# Patient Record
Sex: Female | Born: 1955 | Race: White | Hispanic: No | Smoking: Never smoker
Health system: Southern US, Community
[De-identification: ages and names within clinical notes are randomized; demographics above are authoritative.]

## PROBLEM LIST (undated history)

## (undated) ENCOUNTER — Emergency Department (HOSPITAL_BASED_OUTPATIENT_CLINIC_OR_DEPARTMENT_OTHER): Payer: Managed Care, Other (non HMO) | Source: Home / Self Care

## (undated) ENCOUNTER — Emergency Department (HOSPITAL_BASED_OUTPATIENT_CLINIC_OR_DEPARTMENT_OTHER): Admission: EM | Payer: Managed Care, Other (non HMO) | Source: Home / Self Care

## (undated) DIAGNOSIS — C50412 Malignant neoplasm of upper-outer quadrant of left female breast: Secondary | ICD-10-CM

## (undated) DIAGNOSIS — Z923 Personal history of irradiation: Secondary | ICD-10-CM

## (undated) DIAGNOSIS — K219 Gastro-esophageal reflux disease without esophagitis: Secondary | ICD-10-CM

## (undated) DIAGNOSIS — C50919 Malignant neoplasm of unspecified site of unspecified female breast: Secondary | ICD-10-CM

## (undated) DIAGNOSIS — Z87442 Personal history of urinary calculi: Secondary | ICD-10-CM

## (undated) DIAGNOSIS — R011 Cardiac murmur, unspecified: Secondary | ICD-10-CM

## (undated) DIAGNOSIS — T7840XA Allergy, unspecified, initial encounter: Secondary | ICD-10-CM

## (undated) HISTORY — PX: COLONOSCOPY: SHX174

## (undated) HISTORY — DX: Malignant neoplasm of upper-outer quadrant of left female breast: C50.412

## (undated) HISTORY — PX: ABDOMINAL HYSTERECTOMY: SHX81

## (undated) HISTORY — PX: BREAST SURGERY: SHX581

## (undated) HISTORY — PX: PILONIDAL CYST EXCISION: SHX744

---

## 1999-02-09 ENCOUNTER — Ambulatory Visit (HOSPITAL_COMMUNITY): Admission: RE | Admit: 1999-02-09 | Discharge: 1999-02-09 | Payer: Self-pay | Admitting: Orthopedic Surgery

## 1999-02-09 ENCOUNTER — Encounter: Payer: Self-pay | Admitting: Orthopedic Surgery

## 2000-09-17 ENCOUNTER — Other Ambulatory Visit: Admission: RE | Admit: 2000-09-17 | Discharge: 2000-09-17 | Payer: Self-pay | Admitting: Gynecology

## 2000-09-19 ENCOUNTER — Inpatient Hospital Stay (HOSPITAL_COMMUNITY): Admission: RE | Admit: 2000-09-19 | Discharge: 2000-09-21 | Payer: Self-pay | Admitting: Gynecology

## 2000-09-19 ENCOUNTER — Encounter (INDEPENDENT_AMBULATORY_CARE_PROVIDER_SITE_OTHER): Payer: Self-pay | Admitting: Specialist

## 2001-09-24 ENCOUNTER — Other Ambulatory Visit: Admission: RE | Admit: 2001-09-24 | Discharge: 2001-09-24 | Payer: Self-pay | Admitting: Gynecology

## 2002-11-24 ENCOUNTER — Other Ambulatory Visit: Admission: RE | Admit: 2002-11-24 | Discharge: 2002-11-24 | Payer: Self-pay | Admitting: Gynecology

## 2004-08-11 ENCOUNTER — Other Ambulatory Visit: Admission: RE | Admit: 2004-08-11 | Discharge: 2004-08-11 | Payer: Self-pay | Admitting: Gynecology

## 2005-11-07 ENCOUNTER — Other Ambulatory Visit: Admission: RE | Admit: 2005-11-07 | Discharge: 2005-11-07 | Payer: Self-pay | Admitting: Gynecology

## 2006-01-11 ENCOUNTER — Ambulatory Visit: Payer: Self-pay | Admitting: Psychology

## 2006-02-06 ENCOUNTER — Ambulatory Visit: Payer: Self-pay | Admitting: Psychology

## 2006-03-06 ENCOUNTER — Ambulatory Visit: Payer: Self-pay | Admitting: Psychology

## 2006-04-03 ENCOUNTER — Ambulatory Visit: Payer: Self-pay | Admitting: Psychology

## 2006-07-09 ENCOUNTER — Ambulatory Visit: Payer: Self-pay | Admitting: Psychology

## 2007-03-04 ENCOUNTER — Other Ambulatory Visit: Admission: RE | Admit: 2007-03-04 | Discharge: 2007-03-04 | Payer: Self-pay | Admitting: Gynecology

## 2008-11-17 ENCOUNTER — Other Ambulatory Visit: Admission: RE | Admit: 2008-11-17 | Discharge: 2008-11-17 | Payer: Self-pay | Admitting: Family Medicine

## 2009-01-27 ENCOUNTER — Encounter: Admission: RE | Admit: 2009-01-27 | Discharge: 2009-01-27 | Payer: Self-pay | Admitting: Family Medicine

## 2009-09-19 ENCOUNTER — Ambulatory Visit: Payer: Self-pay | Admitting: Diagnostic Radiology

## 2009-09-19 ENCOUNTER — Emergency Department (HOSPITAL_BASED_OUTPATIENT_CLINIC_OR_DEPARTMENT_OTHER): Admission: EM | Admit: 2009-09-19 | Discharge: 2009-09-19 | Payer: Self-pay | Admitting: Emergency Medicine

## 2010-03-24 LAB — BASIC METABOLIC PANEL
BUN: 18 mg/dL (ref 6–23)
CO2: 28 mEq/L (ref 19–32)
Calcium: 9.3 mg/dL (ref 8.4–10.5)
Chloride: 102 mEq/L (ref 96–112)
Creatinine, Ser: 0.7 mg/dL (ref 0.4–1.2)
GFR calc Af Amer: >60 mL/min (ref >60)
GFR calc non Af Amer: >60 mL/min (ref >60)
Glucose, Bld: 118 mg/dL — ABNORMAL HIGH (ref 70–99)
Potassium: 3.9 mEq/L (ref 3.5–5.1)
Sodium: 140 mEq/L (ref 135–145)

## 2010-03-24 LAB — URINALYSIS, ROUTINE W REFLEX MICROSCOPIC
Bilirubin Urine: NEGATIVE
Glucose, UA: NEGATIVE mg/dL
Hgb urine dipstick: NEGATIVE
Ketones, ur: 15 mg/dL — AB
Nitrite: NEGATIVE
Protein, ur: NEGATIVE mg/dL
Specific Gravity, Urine: 1.018 (ref 1.005–1.030)
Urobilinogen, UA: 0.2 mg/dL (ref 0.0–1.0)
pH: 5.5 (ref 5.0–8.0)

## 2010-05-27 NOTE — H&P (Signed)
[facility]  Patient:    Laurie Calhoun, Laurie Calhoun Visit Number: [id number] MRN: [id number]          Service Type: Attending:  Leatha Gilding. Mezer, M.D. Dictated by:   Leatha Gilding. Mezer, M.D. Adm. Date:  09/19/00                           History and Physical  ADMITTING DIAGNOSIS:  Fibroid uterus.  HISTORY OF PRESENT ILLNESS:  The patient is a 55 year old gravida 1, ectopic 1 female admitted with a fibroid uterus, menorrhagia, dyspareunia and back pain for a total abdominal hysterectomy, ? bilateral salpingo-oophorectomy.  Patient has a progressively enlarging fibroid and long-standing menorrhagia, dyspareunia and back pain unresponsive to conservative therapy.  The patient is admitted for definitive therapy.  The patient wishes to proceed with total abdominal hysterectomy and bilateral salpingo-oophorectomy if surgically indicated or if significant pathology.  A total abdominal hysterectomy/? bilateral salpingo-oophorectomy have been discussed in detail with the patient and potential complications including, but not limited to, anesthesia, injury to the bowel, bladder or ureters, possible fistula formation, possible blood loss with a transfusion and its sequelae and possible infection in the pelvis or in the wound.  Permanent sterilization has been stressed.  The patient understands that there is no guarantee to relieve her pelvic pain or her back pain.  Patients questions have been answered.  Postoperative expectations and restrictions have been reviewed with the patient in detail preoperatively.  PAST MEDICAL HISTORY: Surgical:  GIFT x 4, pilonidal cyst, breast biopsy, culdoscopy.  Medical:  Noncontributory.  MEDICATIONS:  None.  ALLERGIES:  PENICILLIN.  SOCIAL HISTORY:  Smokes none.  ETOH:  Occasional.  FAMILY HISTORY:  Positive for lung cancer and colon cancer.  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  PHYSICAL  EXAMINATION:  HEENT:  Negative.  LUNGS:  Clear.  HEART:  Without murmurs.  BREASTS:  Without masses or discharge.  ABDOMEN:  Soft and nontender.  PELVIC:  Exam reveals BUS, vagina and cervix to be normal.  The uterus is enlarged with a large posterior myoma.  Adnexa without palpable masses.  RECTAL:  Negative.  EXTREMITIES:  Negative.  IMPRESSION: 1. Fibroid uterus. 2. Menorrhagia. 3. Dyspareunia. 4. Back pain.  PLAN:  Total abdominal hysterectomy, ? bilateral salpingo-oophorectomy. Dictated by:   Leatha Gilding. Mezer, M.D. Attending:  Leatha Gilding. Mezer, M.D. DD:  09/20/00 TD:  09/20/00 Job: [id number]

## 2010-05-27 NOTE — Op Note (Signed)
[facility]  Patient:    Laurie Calhoun, Laurie Calhoun Visit Number: [id number] MRN: [id number]          Service Type: GYN Location: 1S X003 01 Attending Physician:  Teodora Medici Cabitt Proc. Date: 09/19/00 Admit Date:  09/19/2000   CC:         Harl Bowie, M.D.  [facility]   Operative Report  PREOPERATIVE DIAGNOSES:  Fibroid uterus, menorrhagia, pelvic pain.  POSTOPERATIVE DIAGNOSES:  Fibroid uterus, menorrhagia, pelvic pain, adhesions.  OPERATION PERFORMED:  Total abdominal hysterectomy and bilateral salpingo-oophorectomy and right ueterolysis.  SURGEON:  Leatha Gilding. Mezer, M.D.  ASSISTANT:  Harl Bowie, M.D.  ANESTHESIA:  General endotracheal.  PREPARATION:  Betadine.  DESCRIPTION OF PROCEDURE:  With the patient in the supine position, she was prepped and draped in the routine fashion.  A Pfannenstiel incision was made in the skin and subcutaneous tissue.  The fascia and peritoneum were opened without difficulty.  A brief exploration of the upper abdomen was benign. Exploration of the pelvis revealed the uterus to be approximately 12-14 weeks in size with a large posterior myoma.  The uterus did not elevate well out of the pelvis, as there were posterior adhesions.  It could be felt that both ovaries were adherent to the pelvic sidewall and to the lateral side of the uterus.  The anatomy made the procedure quite challenging.  The round ligaments were suture ligated with #1 chromic and divided.  It was not possible to separate the ovaries from the uterus on either side, and curved Heaney clamps were used in two bites on each side to safely separate the ovaries and uteroovarian ligaments from the side of the uterus.  The anterior leaf of the broad ligament was then more clearly seen, opened, and the bladder taken down bluntly and sharply without difficulty.  The uterine arteries were then clamped, cut, and suture ligated with #1  chromic.  The fundus of the uterus was then excised, providing significant better visibility.  At that time, the ureters were identified on both sides, and the left tube and ovary were adherent to the sidewall all the way down to the cuff, and blunt and sharp dissection was used to elevated these structures.  The peritoneum from the round ligament to the infundibulopelvic ligament was opened.  The infundibulopelvic ligament easily isolated, the ureter identified, the pedicle clamped, cut, and free-tied with #1 chromic and suture ligated with #1 chromic.  The cardinal ligaments were then taken in several bites, clamped, cut, and suture ligated with #1 chromic.  The uterosacral ligaments were taken separately, clamped, cut, and suture ligated with #1 chromic.  The vagina was entered laterally on the right side, and the specimen excised with circumferential dissection.  The angles were then closed with TeLinde type angled sutures of #1 chromic, and the cuff was then whipped anteriorly and posteriorly with running lock #1 chromic suture.  Two anterior and posterior stitches were placed to approximate the opening of the vagina.  There were several bleeding points in the base of the bladder and at the cuff which were controlled with cautery and a free-tie of 2-0 chromic suture.  Attention was then paid to the right tube and ovary which were densely adherent to the pelvic sidewall.  The peritoneum from the round ligament to the uterosacral ligament was then opened, and the ureter was quite deep in the pelvic sidewall, and extensive dissection was required to open the sidewall space, identified the ureter,  and traced its course to safely remove the ovary.  The infundibulopelvic ligament was clamped, cut, and free-tied with #1 chromic and then suture ligated with #1 chromic.  With the ureter in sight, the tube and ovary were then removed.  At the completion of this part of the procedure, the pelvis  was irrigated by copious of warm Lactated Ringers solution and with hemostasis intact, the bladder flap was approximated over the cuff with running 3-0 Vicryl suture.  Both ureters were again identified and were peristalsing bilaterally.  The appendix was close to the right infundibulopelvic ligament but appeared to be normal.  At the completion of the procedure, an effort was made to place the large bowel in the cul-de-sac. The omentum was brought down, and the abdomen was closed in layers using a running 2-0 Vicryl on the peritoneum, running 0 Vicryl to the midline bilaterally on the fascia.  Hemostasis was assured in the subcutaneous tissue, and the skin was closed with staples.  The estimated blood loss was 500 cc. The sponge, needle, and instrument counts were correct.  The patient tolerated the procedure well and was taken to the recovery room in satisfactory condition. Attending Physician:  Rolinda Roan DD:  09/19/00 TD:  09/19/00 Job: [id number]

## 2011-02-06 ENCOUNTER — Other Ambulatory Visit: Payer: Self-pay | Admitting: Family Medicine

## 2011-03-06 ENCOUNTER — Ambulatory Visit (INDEPENDENT_AMBULATORY_CARE_PROVIDER_SITE_OTHER): Payer: Managed Care, Other (non HMO) | Admitting: Psychology

## 2011-03-06 DIAGNOSIS — F4322 Adjustment disorder with anxiety: Secondary | ICD-10-CM

## 2011-03-14 ENCOUNTER — Ambulatory Visit (INDEPENDENT_AMBULATORY_CARE_PROVIDER_SITE_OTHER): Payer: Managed Care, Other (non HMO) | Admitting: Psychology

## 2011-03-14 DIAGNOSIS — F4322 Adjustment disorder with anxiety: Secondary | ICD-10-CM

## 2011-03-30 ENCOUNTER — Ambulatory Visit (INDEPENDENT_AMBULATORY_CARE_PROVIDER_SITE_OTHER): Payer: Managed Care, Other (non HMO) | Admitting: Psychology

## 2011-03-30 DIAGNOSIS — F4322 Adjustment disorder with anxiety: Secondary | ICD-10-CM

## 2011-04-05 ENCOUNTER — Ambulatory Visit: Payer: Managed Care, Other (non HMO) | Admitting: Psychology

## 2011-05-05 ENCOUNTER — Ambulatory Visit (INDEPENDENT_AMBULATORY_CARE_PROVIDER_SITE_OTHER): Payer: Managed Care, Other (non HMO) | Admitting: Psychology

## 2011-05-05 DIAGNOSIS — F4322 Adjustment disorder with anxiety: Secondary | ICD-10-CM

## 2011-05-22 ENCOUNTER — Other Ambulatory Visit: Payer: Self-pay | Admitting: Family Medicine

## 2011-05-22 DIAGNOSIS — Z1231 Encounter for screening mammogram for malignant neoplasm of breast: Secondary | ICD-10-CM

## 2011-06-13 ENCOUNTER — Ambulatory Visit: Payer: Managed Care, Other (non HMO)

## 2011-06-15 ENCOUNTER — Ambulatory Visit: Payer: Managed Care, Other (non HMO)

## 2011-06-20 ENCOUNTER — Ambulatory Visit: Payer: Managed Care, Other (non HMO)

## 2011-06-26 ENCOUNTER — Ambulatory Visit
Admission: RE | Admit: 2011-06-26 | Discharge: 2011-06-26 | Disposition: A | Discharge status: Home / Self Care | Payer: Managed Care, Other (non HMO) | Source: Ambulatory Visit | Attending: Family Medicine | Admitting: Family Medicine

## 2011-06-26 DIAGNOSIS — Z1231 Encounter for screening mammogram for malignant neoplasm of breast: Secondary | ICD-10-CM

## 2012-12-17 ENCOUNTER — Ambulatory Visit: Payer: Managed Care, Other (non HMO) | Admitting: Psychology

## 2014-12-09 ENCOUNTER — Emergency Department (HOSPITAL_BASED_OUTPATIENT_CLINIC_OR_DEPARTMENT_OTHER)
Admission: EM | Admit: 2014-12-09 | Discharge: 2014-12-09 | Disposition: A | Discharge status: Home / Self Care | Payer: Managed Care, Other (non HMO) | Attending: Emergency Medicine | Admitting: Emergency Medicine

## 2014-12-09 ENCOUNTER — Encounter (HOSPITAL_BASED_OUTPATIENT_CLINIC_OR_DEPARTMENT_OTHER): Payer: Self-pay | Admitting: Emergency Medicine

## 2014-12-09 ENCOUNTER — Emergency Department (HOSPITAL_BASED_OUTPATIENT_CLINIC_OR_DEPARTMENT_OTHER): Payer: Managed Care, Other (non HMO)

## 2014-12-09 DIAGNOSIS — R011 Cardiac murmur, unspecified: Secondary | ICD-10-CM | POA: Insufficient documentation

## 2014-12-09 DIAGNOSIS — N200 Calculus of kidney: Secondary | ICD-10-CM | POA: Diagnosis not present

## 2014-12-09 DIAGNOSIS — Z880 Allergy status to penicillin: Secondary | ICD-10-CM | POA: Diagnosis not present

## 2014-12-09 DIAGNOSIS — R109 Unspecified abdominal pain: Secondary | ICD-10-CM | POA: Diagnosis present

## 2014-12-09 HISTORY — DX: Cardiac murmur, unspecified: R01.1

## 2014-12-09 LAB — URINALYSIS, ROUTINE W REFLEX MICROSCOPIC
Bilirubin Urine: NEGATIVE
Glucose, UA: NEGATIVE mg/dL
KETONES UR: NEGATIVE mg/dL
LEUKOCYTES UA: NEGATIVE
NITRITE: NEGATIVE
PH: 5 (ref 5.0–8.0)
PROTEIN: NEGATIVE mg/dL
Specific Gravity, Urine: 1.028 (ref 1.005–1.030)

## 2014-12-09 LAB — COMPREHENSIVE METABOLIC PANEL
ALBUMIN: 3.8 g/dL (ref 3.5–5.0)
ALT: 19 U/L (ref 14–54)
AST: 17 U/L (ref 15–41)
Alkaline Phosphatase: 66 U/L (ref 38–126)
Anion gap: 5 (ref 5–15)
BILIRUBIN TOTAL: 0.5 mg/dL (ref 0.3–1.2)
BUN: 19 mg/dL (ref 6–20)
CALCIUM: 8.5 mg/dL — AB (ref 8.9–10.3)
CO2: 25 mmol/L (ref 22–32)
CREATININE: 0.83 mg/dL (ref 0.44–1.00)
Chloride: 110 mmol/L (ref 101–111)
GFR calc Af Amer: >60 mL/min (ref >60)
GLUCOSE: 152 mg/dL — AB (ref 65–99)
POTASSIUM: 4 mmol/L (ref 3.5–5.1)
Sodium: 140 mmol/L (ref 135–145)
TOTAL PROTEIN: 7 g/dL (ref 6.5–8.1)

## 2014-12-09 LAB — CBC WITH DIFFERENTIAL/PLATELET
BASOS ABS: 0 10*3/uL (ref 0.0–0.1)
BASOS PCT: 0 %
Eosinophils Absolute: 0 10*3/uL (ref 0.0–0.7)
Eosinophils Relative: 1 %
HEMATOCRIT: 37.9 % (ref 36.0–46.0)
HEMOGLOBIN: 12.6 g/dL (ref 12.0–15.0)
LYMPHS PCT: 14 %
Lymphs Abs: 1 10*3/uL (ref 0.7–4.0)
MCH: 30.6 pg (ref 26.0–34.0)
MCHC: 33.2 g/dL (ref 30.0–36.0)
MCV: 92 fL (ref 78.0–100.0)
Monocytes Absolute: 0.4 10*3/uL (ref 0.1–1.0)
Monocytes Relative: 6 %
NEUTROS ABS: 5.8 10*3/uL (ref 1.7–7.7)
NEUTROS PCT: 79 %
Platelets: 274 10*3/uL (ref 150–400)
RBC: 4.12 MIL/uL (ref 3.87–5.11)
RDW: 13.5 % (ref 11.5–15.5)
WBC: 7.3 10*3/uL (ref 4.0–10.5)

## 2014-12-09 LAB — URINE MICROSCOPIC-ADD ON: WBC, UA: NONE SEEN WBC/hpf (ref 0–5)

## 2014-12-09 MED ORDER — SODIUM CHLORIDE 0.9 % IV BOLUS (SEPSIS)
1000.0000 mL | Freq: Once | INTRAVENOUS | Status: AC
  Administered 2014-12-09: 1000 mL via INTRAVENOUS

## 2014-12-09 MED ORDER — OXYCODONE-ACETAMINOPHEN 5-325 MG PO TABS: 1.0000 | ORAL_TABLET | Freq: Four times a day (QID) | ORAL | Status: DC | PRN

## 2014-12-09 MED ORDER — ONDANSETRON HCL 4 MG/2ML IJ SOLN
4.0000 mg | Freq: Once | INTRAMUSCULAR | Status: AC
  Administered 2014-12-09: 4 mg via INTRAVENOUS
  Filled 2014-12-09: qty 2

## 2014-12-09 MED ORDER — MORPHINE SULFATE (PF) 4 MG/ML IV SOLN
4.0000 mg | Freq: Once | INTRAVENOUS | Status: AC
  Administered 2014-12-09: 4 mg via INTRAVENOUS
  Filled 2014-12-09: qty 1

## 2014-12-09 MED ORDER — HYDROMORPHONE HCL 1 MG/ML IJ SOLN
1.0000 mg | Freq: Once | INTRAMUSCULAR | Status: AC
  Administered 2014-12-09: 1 mg via INTRAVENOUS
  Filled 2014-12-09: qty 1

## 2014-12-09 MED ORDER — KETOROLAC TROMETHAMINE 30 MG/ML IJ SOLN
30.0000 mg | Freq: Once | INTRAMUSCULAR | Status: AC
  Administered 2014-12-09: 30 mg via INTRAVENOUS
  Filled 2014-12-09: qty 1

## 2014-12-09 NOTE — ED Notes (Signed)
Pt reports onset of abd pain at 2230 last PM similar to when she had a kidney several years ago states she was seen here at [facility] at that time.

## 2014-12-09 NOTE — ED Notes (Signed)
Patient ambulated to BR gait steady denies change in pain level remains 7/10

## 2014-12-09 NOTE — Discharge Instructions (Signed)
You were treated today presumptively for kidney stones. Your workup was otherwise reassuring. You will be given pain medication at home. Urology follow-up as needed. If you have any new or worsening symptoms you should be reevaluated.  Kidney Stones Kidney stones (urolithiasis) are deposits that form inside your kidneys. The intense pain is caused by the stone moving through the urinary tract. When the stone moves, the ureter goes into spasm around the stone. The stone is usually passed in the urine.  CAUSES   A disorder that makes certain neck glands produce too much parathyroid hormone (primary hyperparathyroidism).  A buildup of uric acid crystals, similar to gout in your joints.  Narrowing (stricture) of the ureter.  A kidney obstruction present at birth (congenital obstruction).  Previous surgery on the kidney or ureters.  Numerous kidney infections. SYMPTOMS   Feeling sick to your stomach (nauseous).  Throwing up (vomiting).  Blood in the urine (hematuria).  Pain that usually spreads (radiates) to the groin.  Frequency or urgency of urination. DIAGNOSIS   Taking a history and physical exam.  Blood or urine tests.  CT scan.  Occasionally, an examination of the inside of the urinary bladder (cystoscopy) is performed. TREATMENT   Observation.  Increasing your fluid intake.  Extracorporeal shock wave lithotripsy--This is a noninvasive procedure that uses shock waves to break up kidney stones.  Surgery may be needed if you have severe pain or persistent obstruction. There are various surgical procedures. Most of the procedures are performed with the use of small instruments. Only small incisions are needed to accommodate these instruments, so recovery time is minimized. The size, location, and chemical composition are all important variables that will determine the proper choice of action for you. Talk to your health care provider to better understand your situation so  that you will minimize the risk of injury to yourself and your kidney.  HOME CARE INSTRUCTIONS   Drink enough water and fluids to keep your urine clear or pale yellow. This will help you to pass the stone or stone fragments.  Strain all urine through the provided strainer. Keep all particulate matter and stones for your health care provider to see. The stone causing the pain may be as small as a grain of salt. It is very important to use the strainer each and every time you pass your urine. The collection of your stone will allow your health care provider to analyze it and verify that a stone has actually passed. The stone analysis will often identify what you can do to reduce the incidence of recurrences.  Only take over-the-counter or prescription medicines for pain, discomfort, or fever as directed by your health care provider.  Keep all follow-up visits as told by your health care provider. This is important.  Get follow-up X-rays if required. The absence of pain does not always mean that the stone has passed. It may have only stopped moving. If the urine remains completely obstructed, it can cause loss of kidney function or even complete destruction of the kidney. It is your responsibility to make sure X-rays and follow-ups are completed. Ultrasounds of the kidney can show blockages and the status of the kidney. Ultrasounds are not associated with any radiation and can be performed easily in a matter of minutes.  Make changes to your daily diet as told by your health care provider. You may be told to:  Limit the amount of salt that you eat.  Eat 5 or more servings of fruits and  vegetables each day.  Limit the amount of meat, poultry, fish, and eggs that you eat.  Collect a 24-hour urine sample as told by your health care provider.You may need to collect another urine sample every 6-12 months. SEEK MEDICAL CARE IF:  You experience pain that is progressive and unresponsive to any pain  medicine you have been prescribed. SEEK IMMEDIATE MEDICAL CARE IF:   Pain cannot be controlled with the prescribed medicine.  You have a fever or shaking chills.  The severity or intensity of pain increases over 18 hours and is not relieved by pain medicine.  You develop a new onset of abdominal pain.  You feel faint or pass out.  You are unable to urinate.   This information is not intended to replace advice given to you by your health care provider. Make sure you discuss any questions you have with your health care provider.   Document Released: 12/26/2004 Document Revised: 09/16/2014 Document Reviewed: 05/29/2012 Elsevier Interactive Patient Education [employer].

## 2014-12-09 NOTE — ED Notes (Signed)
Family at bedside. 

## 2014-12-09 NOTE — ED Provider Notes (Signed)
CSN: [id number]     Arrival date & time 12/09/14  0448 History   First MD Initiated Contact with Patient 12/09/14 [phone]     Chief Complaint  Patient presents with  . Abdominal Pain     (Consider location/radiation/quality/duration/timing/severity/associated sxs/prior Treatment) HPI  This is a 59 year old female with a history of kidney stones who presents with right-sided abdominal pain. Patient reports onset of pain last night at 10:30 PM. It is sharp. It comes and goes. Currently it is 8 out of 10. It is consistent with the pain that she had when she had a kidney stone.  Patient also reports urinary urgency and decreased urination. No hematuria or dysuria. Denies any fevers.  She reports associated nausea and vomiting.  Past Medical History  Diagnosis Date  . Heart murmur    Past Surgical History  Procedure Laterality Date  . Abdominal hysterectomy     History reviewed. No pertinent family history. Social History  Substance Use Topics  . Smoking status: Never Smoker   . Smokeless tobacco: Never Used  . Alcohol Use: Yes   OB History    No data available     Review of Systems  Constitutional: Negative for fever.  Respiratory: Negative for chest tightness and shortness of breath.   Cardiovascular: Negative for chest pain.  Gastrointestinal: Positive for nausea, vomiting and abdominal pain.  Genitourinary: Positive for urgency and frequency. Negative for dysuria and flank pain.  Skin: Negative for wound.  All other systems reviewed and are negative.     Allergies  Penicillins  Home Medications   Prior to Admission medications   Medication Sig Start Date End Date Taking? Authorizing Provider  oxyCODONE-acetaminophen (PERCOCET/ROXICET) 5-325 MG tablet Take 1-2 tablets by mouth every 6 (six) hours as needed for severe pain. 12/09/14   Merryl Hacker, MD   BP 140/81 mmHg  Pulse 74  Temp(Src) 98.3 F (36.8 C) (Oral)  Resp 18  Ht 5' 6.5" (1.689 m)  Wt 165 lb  (74.844 kg)  BMI 26.24 kg/m2  SpO2 96%  LMP  Physical Exam  Constitutional: She is oriented to person, place, and time. She appears well-developed and well-nourished. No distress.  HENT:  Head: Normocephalic and atraumatic.  Cardiovascular: Normal rate, regular rhythm and normal heart sounds.   Pulmonary/Chest: Effort normal. No respiratory distress. She has no wheezes.  Abdominal: Soft. Bowel sounds are normal. There is no tenderness. There is no rebound.  Neurological: She is alert and oriented to person, place, and time.  Skin: Skin is warm and dry.  Psychiatric: She has a normal mood and affect.  Nursing note and vitals reviewed.   ED Course  Procedures (including critical care time) Labs Review Labs Reviewed  COMPREHENSIVE METABOLIC PANEL - Abnormal; Notable for the following:    Glucose, Bld 152 (*)    Calcium 8.5 (*)    All other components within normal limits  URINALYSIS, ROUTINE W REFLEX MICROSCOPIC (NOT AT [facility]) - Abnormal; Notable for the following:    APPearance CLOUDY (*)    Hgb urine dipstick LARGE (*)    All other components within normal limits  URINE MICROSCOPIC-ADD ON - Abnormal; Notable for the following:    Squamous Epithelial / LPF 0-5 (*)    Bacteria, UA MANY (*)    All other components within normal limits  CBC WITH DIFFERENTIAL/PLATELET    Imaging Review Dg Abd 1 View  12/09/2014  CLINICAL DATA:  Increasing right lower quadrant pain for 7 hours. Hematuria. EXAM:  ABDOMEN - 1 VIEW COMPARISON:  09/19/2009 FINDINGS: The bowel gas pattern is normal. No radio-opaque calculi or other significant radiographic abnormality are seen. IMPRESSION: Negative. Electronically Signed   By: Andreas Newport M.D.   On: 12/09/2014 06:08   I have personally reviewed and evaluated these images and lab results as part of my medical decision-making.   EKG Interpretation None      MDM   Final diagnoses:  Kidney stone   Patient presents with abdominal pain  consistent with prior kidney stones. Pain was acute in onset. She is afebrile and otherwise nontoxic. She has no reproducible pain on exam.  Low suspicion for appendicitis given onset of pain and no reproducibility on exam. Basic labwork obtained. Patient was given fluids, pain, and nausea medication. Initial lab work is reassuring. She does have hemoglobin in her urine.  Creatinine reassuring. No leukocytosis. No evidence of urinary tract infection. Patient continues to complain of pain.  6:25 AM Patient now with one out of 10 pain after dilaudid. Toradol added.  Will continue to monitor. Patient has previously had CT confirmation of kidney stones. She would like to avoid CT if possible. Ultrasound is not available at this time. If she continues to be well controlled, she can be discharged with the presumptive diagnosis of kidney stones and expectant management. However, if patient has continued or worsening pain, she will be held to have ultrasound at 8:00.  Patient reports improvement of pain. She is requesting discharge.  Patient given urology follow-up. Given pain medication. Given strict return precautions.  After history, exam, and medical workup I feel the patient has been appropriately medically screened and is safe for discharge home. Pertinent diagnoses were discussed with the patient. Patient was given return precautions.      Merryl Hacker, MD 12/09/14 [phone]

## 2014-12-09 NOTE — ED Notes (Signed)
Patient transported to X-ray 

## 2014-12-11 ENCOUNTER — Other Ambulatory Visit (HOSPITAL_BASED_OUTPATIENT_CLINIC_OR_DEPARTMENT_OTHER): Payer: Self-pay | Admitting: Family Medicine

## 2014-12-11 ENCOUNTER — Ambulatory Visit (HOSPITAL_BASED_OUTPATIENT_CLINIC_OR_DEPARTMENT_OTHER)
Admission: RE | Admit: 2014-12-11 | Discharge: 2014-12-11 | Disposition: A | Discharge status: Home / Self Care | Payer: Managed Care, Other (non HMO) | Source: Ambulatory Visit | Attending: Family Medicine | Admitting: Family Medicine

## 2014-12-11 DIAGNOSIS — N135 Crossing vessel and stricture of ureter without hydronephrosis: Secondary | ICD-10-CM | POA: Diagnosis not present

## 2014-12-11 DIAGNOSIS — R109 Unspecified abdominal pain: Secondary | ICD-10-CM | POA: Diagnosis not present

## 2014-12-11 DIAGNOSIS — R319 Hematuria, unspecified: Secondary | ICD-10-CM

## 2014-12-11 DIAGNOSIS — R310 Gross hematuria: Secondary | ICD-10-CM | POA: Diagnosis present

## 2014-12-11 DIAGNOSIS — N201 Calculus of ureter: Secondary | ICD-10-CM | POA: Insufficient documentation

## 2015-10-29 ENCOUNTER — Other Ambulatory Visit: Payer: Self-pay | Admitting: Family Medicine

## 2015-10-29 DIAGNOSIS — Z1231 Encounter for screening mammogram for malignant neoplasm of breast: Secondary | ICD-10-CM

## 2015-11-23 ENCOUNTER — Ambulatory Visit
Admission: RE | Admit: 2015-11-23 | Discharge: 2015-11-23 | Disposition: A | Discharge status: Home / Self Care | Payer: Managed Care, Other (non HMO) | Source: Ambulatory Visit | Attending: Family Medicine | Admitting: Family Medicine

## 2015-11-23 DIAGNOSIS — Z1231 Encounter for screening mammogram for malignant neoplasm of breast: Secondary | ICD-10-CM

## 2015-11-26 ENCOUNTER — Other Ambulatory Visit: Payer: Self-pay

## 2015-11-26 ENCOUNTER — Other Ambulatory Visit: Payer: Self-pay | Admitting: Family Medicine

## 2015-11-26 DIAGNOSIS — R928 Other abnormal and inconclusive findings on diagnostic imaging of breast: Secondary | ICD-10-CM

## 2015-11-30 ENCOUNTER — Other Ambulatory Visit: Payer: Self-pay | Admitting: Family Medicine

## 2015-11-30 ENCOUNTER — Ambulatory Visit
Admission: RE | Admit: 2015-11-30 | Discharge: 2015-11-30 | Disposition: A | Discharge status: Home / Self Care | Payer: Managed Care, Other (non HMO) | Source: Ambulatory Visit | Attending: Family Medicine | Admitting: Family Medicine

## 2015-11-30 DIAGNOSIS — R928 Other abnormal and inconclusive findings on diagnostic imaging of breast: Secondary | ICD-10-CM

## 2015-11-30 DIAGNOSIS — N6489 Other specified disorders of breast: Secondary | ICD-10-CM

## 2015-12-01 ENCOUNTER — Ambulatory Visit
Admission: RE | Admit: 2015-12-01 | Discharge: 2015-12-01 | Disposition: A | Discharge status: Home / Self Care | Payer: Managed Care, Other (non HMO) | Source: Ambulatory Visit | Attending: Family Medicine | Admitting: Family Medicine

## 2015-12-01 DIAGNOSIS — N6489 Other specified disorders of breast: Secondary | ICD-10-CM

## 2015-12-01 DIAGNOSIS — C50919 Malignant neoplasm of unspecified site of unspecified female breast: Secondary | ICD-10-CM

## 2015-12-01 DIAGNOSIS — R928 Other abnormal and inconclusive findings on diagnostic imaging of breast: Secondary | ICD-10-CM

## 2015-12-01 HISTORY — PX: BREAST BIOPSY: SHX20

## 2015-12-01 HISTORY — DX: Malignant neoplasm of unspecified site of unspecified female breast: C50.919

## 2015-12-06 ENCOUNTER — Telehealth: Payer: Self-pay | Admitting: *Deleted

## 2015-12-06 DIAGNOSIS — C50412 Malignant neoplasm of upper-outer quadrant of left female breast: Secondary | ICD-10-CM

## 2015-12-07 ENCOUNTER — Encounter: Payer: Self-pay | Admitting: *Deleted

## 2015-12-07 DIAGNOSIS — C50412 Malignant neoplasm of upper-outer quadrant of left female breast: Secondary | ICD-10-CM

## 2015-12-07 HISTORY — DX: Malignant neoplasm of upper-outer quadrant of left female breast: C50.412

## 2015-12-07 NOTE — Telephone Encounter (Signed)
Confirmed [facility] for 12/15/15 at 0815 .  Instructions and contact information given.

## 2015-12-09 ENCOUNTER — Telehealth: Payer: Self-pay | Admitting: *Deleted

## 2015-12-09 NOTE — Telephone Encounter (Signed)
Mailed [facility] packet to pt. 

## 2015-12-14 NOTE — Progress Notes (Signed)
[place]  [phone] Fax:([phone]) [place] Note   Patient Care Team: Harlan Stains, MD as PCP - General (Family Medicine) Nicholas Lose, MD as Consulting Physician (Hematology and Oncology) Kyung Rudd, MD as Consulting Physician (Radiation Oncology) Rolm Bookbinder, MD as Consulting Physician (General Surgery) 12/15/2015  CHIEF COMPLAINTS/PURPOSE OF CONSULTATION:  Left breast cancer   Oncology History   Malignant neoplasm of upper-outer quadrant of left female breast [facility])   Staging form: Breast, AJCC 7th Edition   - Clinical stage from 12/15/2015: Stage Unknown ([place], N0, M0) - Unsigned      Malignant neoplasm of upper-outer quadrant of left female breast ([place])   11/30/2015 Mammogram    Diagnostic mammogram of left breast showed persistent left breast architectural distortion in the upper outer quadrant, no responding findings on ultrasound, left axilla was negative on ultrasound.      12/01/2015 Initial Diagnosis    Malignant neoplasm of upper-outer quadrant of left female breast ([place])      12/01/2015 Initial Biopsy    Left breast core needle biopsy in the upper outer quadrant showed invasive ductal carcinoma, G1, and atypical ductal hyperplasia.      12/01/2015 Receptors her2    ER 90% positive, PR 100% positive, strong staining, HER-2 negative, Ki-67 5%         HISTORY OF PRESENTING ILLNESS:  Laurie Calhoun 60 y.o. female is here because of her recently diagnosed left breast cancer. She is accompanied by her husband to our multidisciplinary breast clinic today.  This was discovered by screening mammogram on 11/25/2015, which showed architectural distortion in the left breast. Her previous last mammogram was done in June 2013 and it was normal. She had no palpable breast mass or lymphadenopathy, denies any constitutional symptoms. She underwent diagnostic mammogram which showed a persistent architectural distortion  in the upper outer quadrant of left breast, but responding findings on ultrasound. She underwent left breast core needle biopsy which showed invasive ductal carcinoma, grade 1, ER/PR stripe positive, HER-2 negative.  She feels well, denies any significant pain, or other symptoms. She has no significant past medical history, she did have hysterectomy and BSO about 15-20 years ago for fibroids. Family history is positive for breast cancer in her cousin at age of 42. 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  GYN HISTORY  Menarchal: 55 LMP: 38 (hysterectomy and BSO)  Contraceptive:no  HRT: yes, 15 years, stopped in 11/2015 G1P0: She had a 1 topical pregnancy. She has 2 adopted children.   MEDICAL HISTORY:  Past Medical History:  Diagnosis Date  . Heart murmur   . Malignant neoplasm of upper-outer quadrant of left female breast ([place]) 12/07/2015    SURGICAL HISTORY: Past Surgical History:  Procedure Laterality Date  . ABDOMINAL HYSTERECTOMY      SOCIAL HISTORY: Social History   Social History  . Marital status: Married    Spouse name: N/A  . Number of children: N/A  . Years of education: N/A   Occupational History  . Not on file.   Social History Main Topics  . Smoking status: Never Smoker  . Smokeless tobacco: Never Used  . Alcohol use 1.2 oz/week    2 Glasses of wine per week  . Drug use: No  . Sexual activity: Not on file   Other Topics Concern  . Not on file   Social History Narrative  . No narrative on file    FAMILY HISTORY: Family History  Problem Relation Age of Onset  . Lung cancer Mother   . Cancer Mother     lung cancer  . Cancer Father     GI cancer   . Cancer Cousin 70    breast cancer     ALLERGIES:  is allergic to penicillins.  MEDICATIONS:  Current Outpatient Prescriptions  Medication Sig Dispense Refill  . doxycycline (VIBRAMYCIN) 100 MG capsule     . Vitamin D, Ergocalciferol, (DRISDOL) 50000 units CAPS capsule Take  50,000 Units by mouth every 7 (seven) days.    Marland Kitchen HYDROcodone-homatropine (HYCODAN) 5-1.5 MG/5ML syrup     . oxyCODONE-acetaminophen (PERCOCET/ROXICET) 5-325 MG tablet Take 1-2 tablets by mouth every 6 (six) hours as needed for severe pain. 20 tablet 0   No current facility-administered medications for this visit.     REVIEW OF SYSTEMS:   Constitutional: Denies fevers, chills or abnormal night sweats Eyes: Denies blurriness of vision, double vision or watery eyes Ears, nose, mouth, throat, and face: Denies mucositis or sore throat Respiratory: Denies cough, dyspnea or wheezes Cardiovascular: Denies palpitation, chest discomfort or lower extremity swelling Gastrointestinal:  Denies nausea, heartburn or change in bowel habits Skin: Denies abnormal skin rashes Lymphatics: Denies new lymphadenopathy or easy bruising Neurological:Denies numbness, tingling or new weaknesses Behavioral/Psych: Mood is stable, no new changes  All other systems were reviewed with the patient and are negative.  PHYSICAL EXAMINATION: ECOG PERFORMANCE STATUS: 0 - Asymptomatic  Vitals:   12/15/15 0912  BP: 125/60  Pulse: 87  Resp: 18  Temp: 98 F (36.7 C)   Filed Weights   12/15/15 0912  Weight: 181 lb 11.2 oz (82.4 kg)    GENERAL:alert, no distress and comfortable SKIN: skin color, texture, turgor are normal, no rashes or significant lesions EYES: normal, conjunctiva are pink and non-injected, sclera clear OROPHARYNX:no exudate, no erythema and lips, buccal mucosa, and tongue normal  NECK: supple, thyroid normal size, non-tender, without nodularity LYMPH:  no palpable lymphadenopathy in the cervical, axillary or inguinal LUNGS: clear to auscultation and percussion with normal breathing effort HEART: regular rate & rhythm and no murmurs and no lower extremity edema ABDOMEN:abdomen soft, non-tender and normal bowel sounds Musculoskeletal:no cyanosis of digits and no clubbing  PSYCH: alert & oriented x 3  with fluent speech NEURO: no focal motor/sensory deficits Breasts: Breast inspection showed them to be symmetrical with no nipple discharge. (+) Skin bruise in the upper outer quadrant of left breast segmental to biopsy . Palpation of the breasts and axilla revealed no obvious mass that I could appreciate.  LABORATORY DATA:  I have reviewed the data as listed CBC Latest Ref Rng & Units 12/15/2015 12/09/2014  WBC 3.9 - 10.3 10e3/uL 4.9 7.3  Hemoglobin 11.6 - 15.9 g/dL 13.4 12.6  Hematocrit 34.8 - 46.6 % 40.4 37.9  Platelets 145 - 400 10e3/uL 302 274   CMP Latest Ref Rng & Units 12/15/2015 12/09/2014 09/19/2009  Glucose 70 - 140 mg/dl 99 152(H) 118(H)  BUN 7.0 - 26.0 mg/dL 13.7 19 18   Creatinine 0.6 - 1.1 mg/dL 0.8 0.83 0.7  Sodium 136 - 145 mEq/L 141 140 140  Potassium 3.5 - 5.1 mEq/L 4.0 4.0 3.9  Chloride 101 - 111 mmol/L - 110 102  CO2 22 - 29 mEq/L 27 25 28   Calcium 8.4 - 10.4 mg/dL 9.1 8.5(L) 9.3  Total Protein 6.4 - 8.3 g/dL 7.1 7.0 -  Total Bilirubin 0.20 - 1.20 mg/dL 0.67 0.5 -  Alkaline Phos 40 - 150 U/L 70  66 -  AST 5 - 34 U/L 11 17 -  ALT 0 - 55 U/L 14 19 -   PATHOLOGY REPORT  Diagnosis 12/01/2015 Breast, left, needle core biopsy, upper outer - INVASIVE DUCTAL CARCINOMA. - ATYPICAL DUCTAL HYPERPLASIA. - SEE COMMENT. Microscopic Comment The carcinoma appears grade 1. A breast prognostic profile will be performed and the results reported separately. The results were called to the [place] on 12/03/2015. (JBK:k 12/03/15) Results: IMMUNOHISTOCHEMICAL AND MORPHOMETRIC ANALYSIS PERFORMED MANUALLY Estrogen Receptor: 90%, POSITIVE, STRONG STAINING INTENSITY Progesterone Receptor: 100%, POSITIVE, STRONG STAINING INTENSITY Proliferation Marker Ki67: 5%  Results: HER2 - NEGATIVE RATIO OF HER2/CEP17 SIGNALS 1.05 AVERAGE HER2 COPY NUMBER PER CELL 1.95  RADIOGRAPHIC STUDIES: I have personally reviewed the radiological images as listed and agreed with the  findings in the report. US Breast Ltd Uni Left Inc Axilla  Result Date: 11/30/2015 CLINICAL DATA:  Patient recalled from screening for left breast distortion. EXAM: 2D DIGITAL DIAGNOSTIC LEFT MAMMOGRAM WITH CAD AND ADJUNCT TOMO ULTRASOUND LEFT BREAST COMPARISON:  Previous exam(s). ACR Breast Density Category c: The breast tissue is heterogeneously dense, which may obscure small masses. FINDINGS: Spot compression CC and MLO as well as full paddle true lateral views of the left breast were obtained. Questioned distortion within the left breast persists, demonstrated best on the CC view. Mammographic images were processed with CAD. On physical exam, I palpate no discrete mass within the upper-outer left breast. Targeted ultrasound is performed, showing dense tissue with multiple areas of shadowing. Possible sonographic correlate is identified however is not definitive. No left axillary lymphadenopathy. IMPRESSION: Persistent left breast architectural distortion. RECOMMENDATION: Stereotactic guided core needle biopsy left breast architectural distortion. I have discussed the findings and recommendations with the patient. Results were also provided in writing at the conclusion of the visit. If applicable, a reminder letter will be sent to the patient regarding the next appointment. BI-RADS CATEGORY  4: Suspicious. Electronically Signed   By: Lovey Newcomer M.D.   On: 11/30/2015 09:47   Mm Diag Breast Tomo Uni Left  Result Date: 11/30/2015 CLINICAL DATA:  Patient recalled from screening for left breast distortion. EXAM: 2D DIGITAL DIAGNOSTIC LEFT MAMMOGRAM WITH CAD AND ADJUNCT TOMO ULTRASOUND LEFT BREAST COMPARISON:  Previous exam(s). ACR Breast Density Category c: The breast tissue is heterogeneously dense, which may obscure small masses. FINDINGS: Spot compression CC and MLO as well as full paddle true lateral views of the left breast were obtained. Questioned distortion within the left breast persists,  demonstrated best on the CC view. Mammographic images were processed with CAD. On physical exam, I palpate no discrete mass within the upper-outer left breast. Targeted ultrasound is performed, showing dense tissue with multiple areas of shadowing. Possible sonographic correlate is identified however is not definitive. No left axillary lymphadenopathy. IMPRESSION: Persistent left breast architectural distortion. RECOMMENDATION: Stereotactic guided core needle biopsy left breast architectural distortion. I have discussed the findings and recommendations with the patient. Results were also provided in writing at the conclusion of the visit. If applicable, a reminder letter will be sent to the patient regarding the next appointment. BI-RADS CATEGORY  4: Suspicious. Electronically Signed   By: Lovey Newcomer M.D.   On: 11/30/2015 09:47   Mm Screening Breast Tomo Bilateral  Result Date: 11/25/2015 CLINICAL DATA:  Screening. EXAM: 2D DIGITAL SCREENING BILATERAL MAMMOGRAM WITH CAD AND ADJUNCT TOMO COMPARISON:  Previous exam(s). ACR Breast Density Category c: The breast tissue is heterogeneously dense, which may obscure small masses. FINDINGS:  In the left breast, a possible mass warrants further evaluation. In the right breast, no findings suspicious for malignancy. Images were processed with CAD. IMPRESSION: Further evaluation is suggested for possible mass in the left breast. RECOMMENDATION: Diagnostic mammogram and possibly ultrasound of the left breast. (Code:[id number]) The patient will be contacted regarding the findings, and additional imaging will be scheduled. BI-RADS CATEGORY  0: Incomplete. Need additional imaging evaluation and/or prior mammograms for comparison. Electronically Signed   By: Nolon Nations M.D.   On: 11/25/2015 15:54   Mm Clip Placement Left  Result Date: 12/01/2015 CLINICAL DATA:  Evaluate biopsy marker EXAM: DIAGNOSTIC LEFT MAMMOGRAM POST STEREOTACTIC BIOPSY COMPARISON:  Previous  exam(s). FINDINGS: Mammographic images were obtained following stereotactic guided biopsy of left breast distortion. The ribbon shaped biopsy clip is within the biopsied distortion. IMPRESSION: Appropriate placement of biopsy clip. Final Assessment: Post Procedure Mammograms for Marker Placement Electronically Signed   By: Dorise Bullion III M.D   On: 12/01/2015 09:13   Mm Lt Breast Bx W Loc Dev 1st Lesion Image Bx Spec Stereo Guide  Result Date: 12/01/2015 CLINICAL DATA:  Left breast distortion EXAM: LEFT BREAST STEREOTACTIC CORE NEEDLE BIOPSY COMPARISON:  Previous exams. FINDINGS: The patient and I discussed the procedure of stereotactic-guided biopsy including benefits and alternatives. We discussed the high likelihood of a successful procedure. We discussed the risks of the procedure including infection, bleeding, tissue injury, clip migration, and inadequate sampling. Informed written consent was given. The usual time out protocol was performed immediately prior to the procedure. Using sterile technique and 1% Lidocaine as local anesthetic, under stereotactic guidance, a 9 gauge vacuum assisted device was used to perform core needle biopsy of distortion in the upper-outer left breast using a superior approach. At the conclusion of the procedure, a ribbon shape tissue marker clip was deployed into the biopsy cavity. Follow-up 2-view mammogram was performed and dictated separately. IMPRESSION: Stereotactic-guided biopsy of left breast distortion. No apparent complications. Electronically Signed   By: Dorise Bullion III M.D   On: 12/01/2015 09:13    ASSESSMENT & PLAN: 60 -year-old Caucasian female, without significant past medical history, presented with screening discomfort of left breast cancer  1. Malignant neoplasm of upper-outer quadrant of left breast, invasive ductal carcinoma, grade 1, cTxN0M0, ER+/PR+/HER2- --We discussed her imaging findings and the biopsy results in great details. -The  size of the left breast mass is not well defined on the mammogram, there is no) findings on the ultrasound, so we recommend bilateral breast MRI for further evaluation. -Giving the likely early stage disease, she is probably a candidate for lumpectomy and sentinel lymph node biopsy. She is agreeable with that. She was seen by Dr. Donne Hazel today and likely will proceed with surgery soon.  -I recommend a Oncotype Dx test on the surgical sample if her tumor is more than 1 cm, and we'll make a decision about adjuvant chemotherapy based on the Oncotype result. Written material of this test was given to her. She is relatively young and fit, would be a good candidate for chemotherapy if her Oncotype recurrence score is high. -If her surgical sentinel lymph node node positive, I recommend mammaprint for further risk stratification and guide adjuvant chemotherapy. -Giving the strong ER and PR expression in her postmenopausal status, I recommend adjuvant endocrine therapy with aromatase inhibitor for a total of 5-10 years to reduce the risk of cancer recurrence. Potential benefits and side effects were discussed with patient and she is interested. -She was also seen  by radiation oncologist Dr. Lisbeth Renshaw today. She would benefit from adjuvant breast radiation if she undergo lumpectomy. -We also discussed the breast cancer surveillance after her surgery. She will continue annual screening mammogram, self exam, and a routine office visit with lab and exam with [place]. -I encouraged her to have healthy diet and exercise regularly.  -We recommend her to not take any estrogen replacement in the future, she has stopped last week.  Plan -Bilateral breast MRI with and without contrast -She'll proceed with left breast lumpectomy and sentinel lymph node biopsy by Dr. Donne Hazel soon -will do Oncotype DX if her tumor >1.0cm and G1 (or >0.5cm G2/3), or mammaprint if she has positive lymph nodes. -I plan to see her after she  completes adjuvant breast radiation, sooner if her Oncotype shows high risk.  All questions were answered. The patient knows to call the clinic with any problems, questions or concerns. I spent 55 minutes counseling the patient face to face. The total time spent in the appointment was 60 minutes and more than 50% was on counseling.     Truitt Merle, MD 12/15/2015 3:54 PM

## 2015-12-15 ENCOUNTER — Ambulatory Visit: Payer: Managed Care, Other (non HMO) | Attending: General Surgery | Admitting: Physical Therapy

## 2015-12-15 ENCOUNTER — Encounter: Payer: Self-pay | Admitting: Physical Therapy

## 2015-12-15 ENCOUNTER — Other Ambulatory Visit: Payer: Self-pay | Admitting: General Surgery

## 2015-12-15 ENCOUNTER — Ambulatory Visit (HOSPITAL_BASED_OUTPATIENT_CLINIC_OR_DEPARTMENT_OTHER): Payer: Managed Care, Other (non HMO) | Admitting: Hematology

## 2015-12-15 ENCOUNTER — Ambulatory Visit
Admission: RE | Admit: 2015-12-15 | Discharge: 2015-12-15 | Disposition: A | Discharge status: Home / Self Care | Payer: Managed Care, Other (non HMO) | Source: Ambulatory Visit | Attending: Radiation Oncology | Admitting: Radiation Oncology

## 2015-12-15 ENCOUNTER — Encounter: Payer: Self-pay | Admitting: *Deleted

## 2015-12-15 ENCOUNTER — Encounter: Payer: Self-pay | Admitting: Hematology

## 2015-12-15 ENCOUNTER — Other Ambulatory Visit (HOSPITAL_BASED_OUTPATIENT_CLINIC_OR_DEPARTMENT_OTHER): Payer: Managed Care, Other (non HMO)

## 2015-12-15 ENCOUNTER — Ambulatory Visit: Payer: Managed Care, Other (non HMO) | Admitting: Hematology and Oncology

## 2015-12-15 ENCOUNTER — Other Ambulatory Visit: Payer: Self-pay | Admitting: *Deleted

## 2015-12-15 VITALS — BP 125/60 | HR 87 | Temp 98.0°F | Resp 18 | Ht 66.5 in | Wt 181.7 lb

## 2015-12-15 DIAGNOSIS — Z170 Estrogen receptor positive status [ER+]: Secondary | ICD-10-CM | POA: Diagnosis not present

## 2015-12-15 DIAGNOSIS — C50412 Malignant neoplasm of upper-outer quadrant of left female breast: Secondary | ICD-10-CM

## 2015-12-15 DIAGNOSIS — R293 Abnormal posture: Secondary | ICD-10-CM

## 2015-12-15 LAB — CBC WITH DIFFERENTIAL/PLATELET
BASO%: 0.6 % (ref 0.0–2.0)
BASOS ABS: 0 10*3/uL (ref 0.0–0.1)
EOS%: 1.9 % (ref 0.0–7.0)
Eosinophils Absolute: 0.1 10*3/uL (ref 0.0–0.5)
HEMATOCRIT: 40.4 % (ref 34.8–46.6)
HGB: 13.4 g/dL (ref 11.6–15.9)
LYMPH#: 1.8 10*3/uL (ref 0.9–3.3)
LYMPH%: 36.5 % (ref 14.0–49.7)
MCH: 30.6 pg (ref 25.1–34.0)
MCHC: 33.2 g/dL (ref 31.5–36.0)
MCV: 92.1 fL (ref 79.5–101.0)
MONO#: 0.4 10*3/uL (ref 0.1–0.9)
MONO%: 7.6 % (ref 0.0–14.0)
NEUT#: 2.6 10*3/uL (ref 1.5–6.5)
NEUT%: 53.4 % (ref 38.4–76.8)
Platelets: 302 10*3/uL (ref 145–400)
RBC: 4.39 10*6/uL (ref 3.70–5.45)
RDW: 14 % (ref 11.2–14.5)
WBC: 4.9 10*3/uL (ref 3.9–10.3)

## 2015-12-15 LAB — COMPREHENSIVE METABOLIC PANEL
ALT: 14 U/L (ref 0–55)
AST: 11 U/L (ref 5–34)
Albumin: 3.4 g/dL — ABNORMAL LOW (ref 3.5–5.0)
Alkaline Phosphatase: 70 U/L (ref 40–150)
Anion Gap: 9 mEq/L (ref 3–11)
BUN: 13.7 mg/dL (ref 7.0–26.0)
CHLORIDE: 105 meq/L (ref 98–109)
CO2: 27 meq/L (ref 22–29)
CREATININE: 0.8 mg/dL (ref 0.6–1.1)
Calcium: 9.1 mg/dL (ref 8.4–10.4)
EGFR: 80 mL/min/{1.73_m2} — ABNORMAL LOW (ref >90)
GLUCOSE: 99 mg/dL (ref 70–140)
POTASSIUM: 4 meq/L (ref 3.5–5.1)
SODIUM: 141 meq/L (ref 136–145)
Total Bilirubin: 0.67 mg/dL (ref 0.20–1.20)
Total Protein: 7.1 g/dL (ref 6.4–8.3)

## 2015-12-15 NOTE — Progress Notes (Signed)
Nutrition Assessment  Reason for Assessment:  Pt seen in Breast Clinic  ASSESSMENT:   60 year old female with new diagnosis of left breast cancer.  Past medical history of heart murmur  Patient reports normal appetite at this time.  Medications:  Vit D  Labs: reviewed  Anthropometrics:   Height: 66.5 inches Weight: 181 lb BMI: 28.9   NUTRITION DIAGNOSIS: Food and nutrition related knowledge deficit related to new diagnosis of breast cancer as evidenced by no prior need for nutrition related information.  INTERVENTION:   Discussed and provided packet of information regarding nutritional tips for breast cancer patients.  Questions answered.  Teachback method used.  Contact information provided.    MONITORING, EVALUATION, and GOAL: Pt will consume a healthy plant based diet to maintain lean body mass throughout treatment.   Westly Hinnant B. Zenia Resides, [place], [place] (pager)

## 2015-12-15 NOTE — Progress Notes (Signed)
Radiation Oncology         [phone] ________________________________  Name: Laurie Calhoun MRN: [id number]  Date: 12/15/2015  DOB: [birth year]  XB:MWUXL,KGMWNUU S, MD  Rolm Bookbinder, MD     REFERRING PHYSICIAN: Rolm Bookbinder, MD   DIAGNOSIS: The encounter diagnosis was Malignant neoplasm of upper-outer quadrant of left breast in female, estrogen receptor positive ([place]).   HISTORY OF PRESENT ILLNESS: Laurie Calhoun is a 60 y.o. female seen in the multidisciplinary breast clinic for a new diagnosis of left breast cancer. The patient has been followed with screening mammograms and on 11/23/15 a screening exam revealed a possible left breast mass. No right sided lesions were seen. She had diagnostic imaging on 11/30/15 including ultrasound which revealed persistent left sided distortion without defitinative ultrasound correlate, and no axillary adenopathy. 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 She comes today to discuss treatment recommendations for her cancer care.   PREVIOUS RADIATION THERAPY: No   PAST MEDICAL HISTORY:  Past Medical History:  Diagnosis Date  . Heart murmur   . Malignant neoplasm of upper-outer quadrant of left female breast ([place]) 12/07/2015       PAST SURGICAL HISTORY: Past Surgical History:  Procedure Laterality Date  . ABDOMINAL HYSTERECTOMY       FAMILY HISTORY:  Family History  Problem Relation Age of Onset  . Lung cancer Mother      SOCIAL HISTORY:  reports that she has never smoked. She has never used smokeless tobacco. She reports that she drinks about 1.2 oz of alcohol per week . She reports that she does not use drugs. The patient is married and lives in [place]. She works at [employer].   ALLERGIES: Penicillins   MEDICATIONS:  Current Outpatient Prescriptions  Medication Sig Dispense Refill  . doxycycline (VIBRAMYCIN)  100 MG capsule     . HYDROcodone-homatropine (HYCODAN) 5-1.5 MG/5ML syrup     . oxyCODONE-acetaminophen (PERCOCET/ROXICET) 5-325 MG tablet Take 1-2 tablets by mouth every 6 (six) hours as needed for severe pain. 20 tablet 0  . Vitamin D, Ergocalciferol, (DRISDOL) 50000 units CAPS capsule Take 50,000 Units by mouth every 7 (seven) days.     No current facility-administered medications for this encounter.      REVIEW OF SYSTEMS: On review of systems, the patient reports that she is doing well overall. She denies any chest pain, shortness of breath, cough, fevers, chills, night sweats, unintended weight changes. She denies any bowel or bladder disturbances, and denies abdominal pain, nausea or vomiting. She denies any new musculoskeletal or joint aches or pains. A complete review of systems is obtained and is otherwise negative.     PHYSICAL EXAM:  Wt Readings from Last 3 Encounters:  12/15/15 181 lb 11.2 oz (82.4 kg)  12/09/14 165 lb (74.8 kg)   Temp Readings from Last 3 Encounters:  12/15/15 98 F (36.7 C) (Oral)  12/09/14 98.3 F (36.8 C) (Oral)   BP Readings from Last 3 Encounters:  12/15/15 125/60  12/09/14 124/76   Pulse Readings from Last 3 Encounters:  12/15/15 87  12/09/14 74     Pain scale 0/10 In general this is a well appearing Caucasian female in no acute distress. She is alert and oriented x4 and appropriate throughout the examination. HEENT reveals that the patient is normocephalic, atraumatic. EOMs are intact. PERRLA. Skin is intact without any evidence of gross lesions. Cardiovascular exam reveals a regular  rate and rhythm, no clicks rubs or murmurs are auscultated. Chest is clear to auscultation bilaterally. Lymphatic assessment is performed and does not reveal any adenopathy in the cervical, supraclavicular, or inguinal chains. Abdomen has active bowel sounds in all quadrants and is intact. The abdomen is soft, non tender, non distended. Lower extremities are  negative for pretibial pitting edema, deep calf tenderness, cyanosis or clubbing. Bilateral breast exam is performed and reveals post biopsy changes including minimal ecchymosis of the left breast, no palpable adenopathy is noted on the left. The right reveals normal breast tissue without palpable abnormality. No adenopathy is otherwise noted on the right. And no nipple bleeding or discharge is noted of either breast.   ECOG = 0  0 - Asymptomatic (Fully active, able to carry on all predisease activities without restriction)  1 - Symptomatic but completely ambulatory (Restricted in physically strenuous activity but ambulatory and able to carry out work of a light or sedentary nature. For example, light housework, office work)  2 - Symptomatic, <50% in bed during the day (Ambulatory and capable of all self care but unable to carry out any work activities. Up and about more than 50% of waking hours)  3 - Symptomatic, >50% in bed, but not bedbound (Capable of only limited self-care, confined to bed or chair 50% or more of waking hours)  4 - Bedbound (Completely disabled. Cannot carry on any self-care. Totally confined to bed or chair)  5 - Death   Eustace Pen MM, Creech RH, Tormey DC, et al. [phone]). "Toxicity and response criteria of the [employer]". [place] Oncol. 5 (6): 649-55    LABORATORY DATA:  Lab Results  Component Value Date   WBC 4.9 12/15/2015   HGB 13.4 12/15/2015   HCT 40.4 12/15/2015   MCV 92.1 12/15/2015   PLT 302 12/15/2015   Lab Results  Component Value Date   NA 141 12/15/2015   K 4.0 12/15/2015   CL 110 12/09/2014   CO2 27 12/15/2015   Lab Results  Component Value Date   ALT 14 12/15/2015   AST 11 12/15/2015   ALKPHOS 70 12/15/2015   BILITOT 0.67 12/15/2015      RADIOGRAPHY: US Breast Ltd Uni Left Inc Axilla  Result Date: 11/30/2015 CLINICAL DATA:  Patient recalled from screening for left breast distortion. EXAM: 2D DIGITAL  DIAGNOSTIC LEFT MAMMOGRAM WITH CAD AND ADJUNCT TOMO ULTRASOUND LEFT BREAST COMPARISON:  Previous exam(s). ACR Breast Density Category c: The breast tissue is heterogeneously dense, which may obscure small masses. FINDINGS: Spot compression CC and MLO as well as full paddle true lateral views of the left breast were obtained. Questioned distortion within the left breast persists, demonstrated best on the CC view. Mammographic images were processed with CAD. On physical exam, I palpate no discrete mass within the upper-outer left breast. Targeted ultrasound is performed, showing dense tissue with multiple areas of shadowing. Possible sonographic correlate is identified however is not definitive. No left axillary lymphadenopathy. IMPRESSION: Persistent left breast architectural distortion. RECOMMENDATION: Stereotactic guided core needle biopsy left breast architectural distortion. I have discussed the findings and recommendations with the patient. Results were also provided in writing at the conclusion of the visit. If applicable, a reminder letter will be sent to the patient regarding the next appointment. BI-RADS CATEGORY  4: Suspicious. Electronically Signed   By: Lovey Newcomer M.D.   On: 11/30/2015 09:47   Mm Diag Breast Tomo Uni Left  Result Date: 11/30/2015 CLINICAL DATA:  Patient recalled from screening for left breast distortion. EXAM: 2D DIGITAL DIAGNOSTIC LEFT MAMMOGRAM WITH CAD AND ADJUNCT TOMO ULTRASOUND LEFT BREAST COMPARISON:  Previous exam(s). ACR Breast Density Category c: The breast tissue is heterogeneously dense, which may obscure small masses. FINDINGS: Spot compression CC and MLO as well as full paddle true lateral views of the left breast were obtained. Questioned distortion within the left breast persists, demonstrated best on the CC view. Mammographic images were processed with CAD. On physical exam, I palpate no discrete mass within the upper-outer left breast. Targeted ultrasound is  performed, showing dense tissue with multiple areas of shadowing. Possible sonographic correlate is identified however is not definitive. No left axillary lymphadenopathy. IMPRESSION: Persistent left breast architectural distortion. RECOMMENDATION: Stereotactic guided core needle biopsy left breast architectural distortion. I have discussed the findings and recommendations with the patient. Results were also provided in writing at the conclusion of the visit. If applicable, a reminder letter will be sent to the patient regarding the next appointment. BI-RADS CATEGORY  4: Suspicious. Electronically Signed   By: Lovey Newcomer M.D.   On: 11/30/2015 09:47   Mm Screening Breast Tomo Bilateral  Result Date: 11/25/2015 CLINICAL DATA:  Screening. EXAM: 2D DIGITAL SCREENING BILATERAL MAMMOGRAM WITH CAD AND ADJUNCT TOMO COMPARISON:  Previous exam(s). ACR Breast Density Category c: The breast tissue is heterogeneously dense, which may obscure small masses. FINDINGS: In the left breast, a possible mass warrants further evaluation. In the right breast, no findings suspicious for malignancy. Images were processed with CAD. IMPRESSION: Further evaluation is suggested for possible mass in the left breast. RECOMMENDATION: Diagnostic mammogram and possibly ultrasound of the left breast. (Code:[id number]) The patient will be contacted regarding the findings, and additional imaging will be scheduled. BI-RADS CATEGORY  0: Incomplete. Need additional imaging evaluation and/or prior mammograms for comparison. Electronically Signed   By: Nolon Nations M.D.   On: 11/25/2015 15:54   Mm Clip Placement Left  Result Date: 12/01/2015 CLINICAL DATA:  Evaluate biopsy marker EXAM: DIAGNOSTIC LEFT MAMMOGRAM POST STEREOTACTIC BIOPSY COMPARISON:  Previous exam(s). FINDINGS: Mammographic images were obtained following stereotactic guided biopsy of left breast distortion. The ribbon shaped biopsy clip is within the biopsied distortion.  IMPRESSION: Appropriate placement of biopsy clip. Final Assessment: Post Procedure Mammograms for Marker Placement Electronically Signed   By: Dorise Bullion III M.D   On: 12/01/2015 09:13   Mm Lt Breast Bx W Loc Dev 1st Lesion Image Bx Spec Stereo Guide  Result Date: 12/01/2015 CLINICAL DATA:  Left breast distortion EXAM: LEFT BREAST STEREOTACTIC CORE NEEDLE BIOPSY COMPARISON:  Previous exams. FINDINGS: The patient and I discussed the procedure of stereotactic-guided biopsy including benefits and alternatives. We discussed the high likelihood of a successful procedure. We discussed the risks of the procedure including infection, bleeding, tissue injury, clip migration, and inadequate sampling. Informed written consent was given. The usual time out protocol was performed immediately prior to the procedure. Using sterile technique and 1% Lidocaine as local anesthetic, under stereotactic guidance, a 9 gauge vacuum assisted device was used to perform core needle biopsy of distortion in the upper-outer left breast using a superior approach. At the conclusion of the procedure, a ribbon shape tissue marker clip was deployed into the biopsy cavity. Follow-up 2-view mammogram was performed and dictated separately. IMPRESSION: Stereotactic-guided biopsy of left breast distortion. No apparent complications. Electronically Signed   By: Dorise Bullion III M.D   On: 12/01/2015 09:13       IMPRESSION/PLAN: 1. [id number] ER/PR  positive ductal carcinoma of the left breast. Dr. Lisbeth Renshaw discusses the pathology findings and reviews the nature of invasive breast disease. The consensus from the breast conference include further work up to document size of her tumor with an MRI of the breast. Provided that she does not have a large tumor, we would anticipate breast conservation with lumpectomy with sentinel mapping. Oncotype testing would be considered if her tumor is larger than 1cm on final pathology.  Provided that chemotherapy  is not indicated, the patient's course would then be followed by external radiotherapy to the breast followed by antiestrogen therapy. We discussed the risks, benefits, short, and long term effects of radiotherapy, and the patient is interested in proceeding.  Dr. Lisbeth Renshaw specifically outlines the special considerations of left sided cancers, related to the heart, and discusses the utility of breath hold technique. He discusses the delivery and logistics of radiotherapy, and recommends a course of 20 fractions of treatment over 4 weeks. We will see her back about 2 weeks after surgery to move forward with the simulation and planning process and anticipate starting radiotherapy about 4 weeks after surgery.    The above documentation reflects my direct findings during this shared patient visit. Please see the separate note by Dr. Lisbeth Renshaw on this date for the remainder of the patient's plan of care.    Carola Rhine, PAC

## 2015-12-15 NOTE — Patient Instructions (Signed)

## 2015-12-15 NOTE — Therapy (Signed)
[place], [place], [address] Phone: [phone]   Fax:  [phone]  Physical Therapy Evaluation  Patient Details  Name: Laurie Calhoun MRN: [id number] Date of Birth: [birth year] Referring Provider: Dr. Rolm Bookbinder  Encounter Date: 12/15/2015      PT End of Session - 12/15/15 1310    Visit Number 1   Number of Visits 1   PT Start Time 1125   PT Stop Time 1153   PT Time Calculation (min) 28 min   Activity Tolerance Patient tolerated treatment well   Behavior During Therapy [facility] for tasks assessed/performed      Past Medical History:  Diagnosis Date  . Heart murmur   . Malignant neoplasm of upper-outer quadrant of left female breast ([place]) 12/07/2015    Past Surgical History:  Procedure Laterality Date  . ABDOMINAL HYSTERECTOMY      There were no vitals filed for this visit.       Subjective Assessment - 12/15/15 1311    Subjective Patient reports she is here today to be seen by her medical team for her newly diagnosed left breast cancer.   Patient is accompained by: Family member   Pertinent History Patient was diagnosed on 11/23/15 with left grade 1 invasive ductal carcinoma breast cancer. It is located in the upper outer quadrant and is a distortion so the size is not yet determined until she has an MRI. It is ER/PR positive and HER2 negative with a ki67 of 5%.   Patient Stated Goals Reduce lymphedema risk and learn post op shoulder ROM HEP            [facility] PT Assessment - 12/15/15 0001      Assessment   Medical Diagnosis Left breast cancer   Referring Provider Dr. Rolm Bookbinder   Onset Date/Surgical Date 11/23/15   Hand Dominance Right   Prior Therapy none     Precautions   Precautions Other (comment)   Precaution Comments Active cancer     Restrictions   Weight Bearing Restrictions No     Balance Screen   Has the patient fallen in the past 6 months No   Has the patient had a decrease in activity level because of a fear of falling?  No   Is the patient reluctant to leave their home because of a fear of falling?  No     Home Environment   Living Environment Private residence   Living Arrangements Spouse/significant other   Available Help at Discharge Family     Prior Function   Level of Independence Independent   Vocation Full time employment   Armed forces technical officer at [employer]   Leisure She does not exercise     Cognition   Overall Cognitive Status Within Functional Limits for tasks assessed     Posture/Postural Control   Posture/Postural Control Postural limitations   Postural Limitations Rounded Shoulders;Forward head     ROM / Strength   AROM / PROM / Strength AROM;Strength     AROM   AROM Assessment Site Shoulder;Cervical   Right/Left Shoulder Right;Left   Right Shoulder Extension 45 Degrees   Right Shoulder Flexion 140 Degrees   Right Shoulder ABduction 165 Degrees   Right Shoulder Internal Rotation 77 Degrees   Right Shoulder External Rotation 80 Degrees   Left Shoulder Extension 46 Degrees   Left Shoulder Flexion 155 Degrees   Left Shoulder ABduction 167 Degrees   Left Shoulder Internal Rotation  76 Degrees   Left Shoulder External Rotation 88 Degrees   Cervical Flexion WNL   Cervical Extension WNL   Cervical - Right Side Bend WNL   Cervical - Left Side Bend WNL   Cervical - Right Rotation WNL   Cervical - Left Rotation WNL     Strength   Overall Strength Within functional limits for tasks performed           LYMPHEDEMA/ONCOLOGY QUESTIONNAIRE - 12/15/15 1309      Type   Cancer Type Left breast cancer     Lymphedema Assessments   Lymphedema Assessments Upper extremities     Right Upper Extremity Lymphedema   10 cm Proximal to Olecranon Process 27.5 cm   Olecranon Process 23.9 cm   10 cm Proximal to Ulnar Styloid Process 20 cm   Just Proximal to Ulnar Styloid Process 15.4 cm    Across Hand at [employer] 18.1 cm   At [place] of 2nd Digit 6.5 cm     Left Upper Extremity Lymphedema   10 cm Proximal to Olecranon Process 27.5 cm   Olecranon Process 24 cm   10 cm Proximal to Ulnar Styloid Process 19.9 cm   Just Proximal to Ulnar Styloid Process 15.2 cm   Across Hand at [employer] 18.6 cm   At [place] of 2nd Digit 6.8 cm        Patient was instructed today in a home exercise program today for post op shoulder range of motion. These included active assist shoulder flexion in sitting, scapular retraction, wall walking with shoulder abduction, and hands behind head external rotation.  She was encouraged to do these twice a day, holding 3 seconds and repeating 5 times when permitted by her physician.          PT Education - 12/15/15 1310    Education provided Yes   Education Details Lymphedema risk reduction and post op shoulder ROM HEP   Person(s) Educated Patient;Spouse   Methods Explanation;Demonstration;Handout   Comprehension Returned demonstration;Verbalized understanding              Breast Clinic Goals - 12/15/15 1318      Patient will be able to verbalize understanding of pertinent lymphedema risk reduction practices relevant to her diagnosis specifically related to skin care.   Time 1   Period Days   Status Achieved     Patient will be able to return demonstrate and/or verbalize understanding of the post-op home exercise program related to regaining shoulder range of motion.   Time 1   Period Days   Status Achieved     Patient will be able to verbalize understanding of the importance of attending the postoperative After Breast Cancer Class for further lymphedema risk reduction education and therapeutic exercise.   Time 1   Period Days   Status Achieved              Plan - 12/15/15 1311    Clinical Impression Statement Patient was diagnosed on 11/23/15 with left grade 1 invasive ductal carcinoma breast cancer. It is located  in the upper outer quadrant and is a distortion so the size is not yet determined until she has an MRI. It is ER/PR positive and HER2 negative with a ki67 of 5%.  Her multidisciplinary medical team met prior to her assessments to determine a recommended treatment plan.  She is planning to have an MRI and then likely a left lumpectomy and sentinel node biopsy followed by radiation  and anti-estrogen therapy.  She may benefit from post op PT to regain shoulder ROM and reduce lymphedema risk.  Due to her lack of comorbidities, her eval is of low complexity.   Rehab Potential Excellent   Clinical Impairments Affecting Rehab Potential none   PT Frequency One time visit   PT Treatment/Interventions Patient/family education;Therapeutic exercise   PT Next Visit Plan Will f/u after surgery to determine PT needs   PT Home Exercise Plan Post op shoulder ROM HEP   Consulted and Agree with Plan of Care Patient;Family member/caregiver   Family Member Consulted Husband      Patient will benefit from skilled therapeutic intervention in order to improve the following deficits and impairments:  Postural dysfunction, Pain, Decreased knowledge of precautions, Impaired UE functional use, Decreased range of motion  Visit Diagnosis: Carcinoma of upper-outer quadrant of left breast in female, estrogen receptor positive ([place]) - Plan: PT plan of care cert/re-cert  Abnormal posture - Plan: PT plan of care cert/re-cert   Patient will follow up at outpatient cancer rehab if needed following surgery.  If the patient requires physical therapy at that time, a specific plan will be dictated and sent to the referring physician for approval. The patient was educated today on appropriate basic range of motion exercises to begin post operatively and the importance of attending the After Breast Cancer class following surgery.  Patient was educated today on lymphedema risk reduction practices as it pertains to recommendations that  will benefit the patient immediately following surgery.  She verbalized good understanding.  No additional physical therapy is indicated at this time.     Problem List Patient Active Problem List   Diagnosis Date Noted  . Malignant neoplasm of upper-outer quadrant of left female breast ([place]) 12/07/2015   Annia Friendly, PT 12/15/15 1:22 PM  [place], [place], [address] Phone: [phone]   Fax:  [phone]  Name: Laurie Calhoun MRN: [id number] Date of Birth: 02/13/1955

## 2015-12-15 NOTE — Progress Notes (Signed)
Clinical Social Work [place] Psychosocial Distress Screening [place]  Patient completed distress screening protocol and scored a 2 on the Psychosocial Distress Thermometer which indicates mild distress. Clinical Social Worker met with patient and patients husband in [facility] to assess for distress and other psychosocial needs. Patient stated she was felt positive after meeting with the treatment team and getting more information on her treatment plan. CSW and patient discussed common feeling and emotions when being diagnosed with cancer, and the importance of support during treatment. CSW informed patient of the support team and support services at [facility]. CSW provided contact information and encouraged patient to call with any questions or concerns.   ON[facility] DISTRESS SCREENING 12/15/2015  Screening Type Initial Screening  Distress experienced in past week (1-10) 2    Johnnye Lana, MSW, LCSW, OSW-C Clinical Social Worker [facility] [phone]

## 2015-12-16 ENCOUNTER — Other Ambulatory Visit: Payer: Self-pay | Admitting: General Surgery

## 2015-12-16 DIAGNOSIS — C50412 Malignant neoplasm of upper-outer quadrant of left female breast: Secondary | ICD-10-CM

## 2015-12-18 ENCOUNTER — Ambulatory Visit (HOSPITAL_COMMUNITY)
Admission: RE | Admit: 2015-12-18 | Discharge: 2015-12-18 | Disposition: A | Discharge status: Home / Self Care | Payer: Managed Care, Other (non HMO) | Source: Ambulatory Visit | Attending: General Surgery | Admitting: General Surgery

## 2015-12-18 DIAGNOSIS — C50412 Malignant neoplasm of upper-outer quadrant of left female breast: Secondary | ICD-10-CM | POA: Diagnosis not present

## 2015-12-18 DIAGNOSIS — Z170 Estrogen receptor positive status [ER+]: Secondary | ICD-10-CM | POA: Diagnosis not present

## 2015-12-18 MED ORDER — GADOBENATE DIMEGLUMINE 529 MG/ML IV SOLN
17.0000 mL | Freq: Once | INTRAVENOUS | Status: AC | PRN
  Administered 2015-12-18: 17 mL via INTRAVENOUS

## 2015-12-20 ENCOUNTER — Telehealth: Payer: Self-pay | Admitting: *Deleted

## 2015-12-20 ENCOUNTER — Encounter: Payer: Self-pay | Admitting: *Deleted

## 2015-12-20 ENCOUNTER — Other Ambulatory Visit: Payer: Self-pay | Admitting: General Surgery

## 2015-12-20 ENCOUNTER — Encounter (HOSPITAL_COMMUNITY): Payer: Self-pay | Admitting: *Deleted

## 2015-12-20 DIAGNOSIS — C50412 Malignant neoplasm of upper-outer quadrant of left female breast: Secondary | ICD-10-CM

## 2015-12-20 DIAGNOSIS — Z170 Estrogen receptor positive status [ER+]: Principal | ICD-10-CM

## 2015-12-20 MED ORDER — GABAPENTIN 300 MG PO CAPS
300.0000 mg | ORAL_CAPSULE | ORAL | Status: AC
  Administered 2015-12-21: 300 mg via ORAL
  Filled 2015-12-20: qty 1

## 2015-12-20 MED ORDER — CIPROFLOXACIN IN D5W 400 MG/200ML IV SOLN
400.0000 mg | INTRAVENOUS | Status: AC
  Administered 2015-12-21: 400 mg via INTRAVENOUS
  Filled 2015-12-20: qty 200

## 2015-12-20 MED ORDER — ACETAMINOPHEN 500 MG PO TABS
1000.0000 mg | ORAL_TABLET | ORAL | Status: AC
  Administered 2015-12-21: 1000 mg via ORAL
  Filled 2015-12-20: qty 2

## 2015-12-20 NOTE — Telephone Encounter (Signed)
  Oncology Nurse Navigator Documentation      )                                                           Oncology Nurse Navigator Documentation      )

## 2015-12-20 NOTE — Progress Notes (Signed)
Spoke with pt for pre-op call. 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

## 2015-12-21 ENCOUNTER — Ambulatory Visit
Admission: RE | Admit: 2015-12-21 | Discharge: 2015-12-21 | Disposition: A | Discharge status: Home / Self Care | Payer: Managed Care, Other (non HMO) | Source: Ambulatory Visit | Attending: General Surgery | Admitting: General Surgery

## 2015-12-21 ENCOUNTER — Encounter (HOSPITAL_COMMUNITY): Payer: Self-pay | Admitting: *Deleted

## 2015-12-21 ENCOUNTER — Ambulatory Visit (HOSPITAL_COMMUNITY): Payer: Managed Care, Other (non HMO) | Admitting: Anesthesiology

## 2015-12-21 ENCOUNTER — Ambulatory Visit (HOSPITAL_COMMUNITY)
Admission: RE | Admit: 2015-12-21 | Discharge: 2015-12-21 | Disposition: A | Discharge status: Home / Self Care | Payer: Managed Care, Other (non HMO) | Source: Ambulatory Visit | Attending: General Surgery | Admitting: General Surgery

## 2015-12-21 ENCOUNTER — Encounter (HOSPITAL_COMMUNITY)
Admission: RE | Disposition: A | Discharge status: Home / Self Care | Payer: Self-pay | Source: Ambulatory Visit | Attending: General Surgery

## 2015-12-21 DIAGNOSIS — C50412 Malignant neoplasm of upper-outer quadrant of left female breast: Secondary | ICD-10-CM

## 2015-12-21 DIAGNOSIS — Z86010 Personal history of colonic polyps: Secondary | ICD-10-CM | POA: Insufficient documentation

## 2015-12-21 DIAGNOSIS — N6012 Diffuse cystic mastopathy of left breast: Secondary | ICD-10-CM | POA: Insufficient documentation

## 2015-12-21 DIAGNOSIS — Z880 Allergy status to penicillin: Secondary | ICD-10-CM | POA: Diagnosis not present

## 2015-12-21 DIAGNOSIS — R011 Cardiac murmur, unspecified: Secondary | ICD-10-CM | POA: Diagnosis not present

## 2015-12-21 DIAGNOSIS — K219 Gastro-esophageal reflux disease without esophagitis: Secondary | ICD-10-CM | POA: Diagnosis not present

## 2015-12-21 DIAGNOSIS — Z170 Estrogen receptor positive status [ER+]: Secondary | ICD-10-CM | POA: Insufficient documentation

## 2015-12-21 DIAGNOSIS — Z90710 Acquired absence of both cervix and uterus: Secondary | ICD-10-CM | POA: Insufficient documentation

## 2015-12-21 HISTORY — DX: Personal history of urinary calculi: Z87.442

## 2015-12-21 HISTORY — PX: BREAST LUMPECTOMY WITH RADIOACTIVE SEED AND SENTINEL LYMPH NODE BIOPSY: SHX6550

## 2015-12-21 HISTORY — DX: Gastro-esophageal reflux disease without esophagitis: K21.9

## 2015-12-21 HISTORY — PX: BREAST LUMPECTOMY: SHX2

## 2015-12-21 SURGERY — BREAST LUMPECTOMY WITH RADIOACTIVE SEED AND SENTINEL LYMPH NODE BIOPSY
Anesthesia: General | Site: Breast | Laterality: Left

## 2015-12-21 MED ORDER — TECHNETIUM TC 99M SULFUR COLLOID FILTERED
1.0000 | Freq: Once | INTRAVENOUS | Status: AC | PRN
  Administered 2015-12-21: 1 via INTRADERMAL

## 2015-12-21 MED ORDER — PHENYLEPHRINE HCL 10 MG/ML IJ SOLN
INTRAMUSCULAR | Status: DC | PRN
  Administered 2015-12-21: 80 ug via INTRAVENOUS

## 2015-12-21 MED ORDER — ONDANSETRON HCL 4 MG/2ML IJ SOLN
INTRAMUSCULAR | Status: AC
  Filled 2015-12-21: qty 2

## 2015-12-21 MED ORDER — LACTATED RINGERS IV SOLN
INTRAVENOUS | Status: DC
  Administered 2015-12-21: 11:00:00 via INTRAVENOUS

## 2015-12-21 MED ORDER — LIDOCAINE 2% (20 MG/ML) 5 ML SYRINGE
INTRAMUSCULAR | Status: DC | PRN
  Administered 2015-12-21: 80 mg via INTRAVENOUS

## 2015-12-21 MED ORDER — 0.9 % SODIUM CHLORIDE (POUR BTL) OPTIME
TOPICAL | Status: DC | PRN
  Administered 2015-12-21: 1000 mL

## 2015-12-21 MED ORDER — HYDROMORPHONE HCL 1 MG/ML IJ SOLN
INTRAMUSCULAR | Status: AC
  Filled 2015-12-21: qty 0.5

## 2015-12-21 MED ORDER — OXYCODONE-ACETAMINOPHEN 10-325 MG PO TABS: 1.0000 | ORAL_TABLET | Freq: Four times a day (QID) | ORAL | 0 refills | Status: DC | PRN

## 2015-12-21 MED ORDER — BUPIVACAINE-EPINEPHRINE (PF) 0.25% -1:200000 IJ SOLN
INTRAMUSCULAR | Status: AC
  Filled 2015-12-21: qty 30

## 2015-12-21 MED ORDER — HYDROMORPHONE HCL 1 MG/ML IJ SOLN: 0.2500 mg | INTRAMUSCULAR | Status: DC | PRN

## 2015-12-21 MED ORDER — BUPIVACAINE-EPINEPHRINE 0.25% -1:200000 IJ SOLN
INTRAMUSCULAR | Status: DC | PRN
  Administered 2015-12-21: 7 mL

## 2015-12-21 MED ORDER — METHYLENE BLUE 0.5 % INJ SOLN
INTRAVENOUS | Status: AC
  Filled 2015-12-21: qty 10

## 2015-12-21 MED ORDER — MIDAZOLAM HCL 2 MG/2ML IJ SOLN
INTRAMUSCULAR | Status: AC
  Administered 2015-12-21: 2 mg via INTRAVENOUS
  Filled 2015-12-21: qty 2

## 2015-12-21 MED ORDER — PHENYLEPHRINE 40 MCG/ML (10ML) SYRINGE FOR IV PUSH (FOR BLOOD PRESSURE SUPPORT)
PREFILLED_SYRINGE | INTRAVENOUS | Status: AC
  Filled 2015-12-21: qty 10

## 2015-12-21 MED ORDER — MIDAZOLAM HCL 2 MG/2ML IJ SOLN
INTRAMUSCULAR | Status: AC
  Filled 2015-12-21: qty 2

## 2015-12-21 MED ORDER — FENTANYL CITRATE (PF) 100 MCG/2ML IJ SOLN
INTRAMUSCULAR | Status: AC
  Filled 2015-12-21: qty 2

## 2015-12-21 MED ORDER — FENTANYL CITRATE (PF) 100 MCG/2ML IJ SOLN
INTRAMUSCULAR | Status: AC
  Administered 2015-12-21: 100 ug via INTRAVENOUS
  Filled 2015-12-21: qty 2

## 2015-12-21 MED ORDER — SODIUM CHLORIDE 0.9 % IJ SOLN
INTRAMUSCULAR | Status: AC
  Filled 2015-12-21: qty 10

## 2015-12-21 MED ORDER — PROPOFOL 10 MG/ML IV BOLUS
INTRAVENOUS | Status: AC
  Filled 2015-12-21: qty 20

## 2015-12-21 MED ORDER — PROPOFOL 10 MG/ML IV BOLUS
INTRAVENOUS | Status: DC | PRN
  Administered 2015-12-21: 170 mg via INTRAVENOUS

## 2015-12-21 MED ORDER — OXYCODONE HCL 5 MG PO TABS
5.0000 mg | ORAL_TABLET | ORAL | Status: DC | PRN
  Administered 2015-12-21: 10 mg via ORAL
  Filled 2015-12-21: qty 2

## 2015-12-21 MED ORDER — ONDANSETRON HCL 4 MG/2ML IJ SOLN
INTRAMUSCULAR | Status: DC | PRN
  Administered 2015-12-21: 4 mg via INTRAVENOUS

## 2015-12-21 MED ORDER — MIDAZOLAM HCL 2 MG/2ML IJ SOLN
2.0000 mg | Freq: Once | INTRAMUSCULAR | Status: AC
  Administered 2015-12-21: 2 mg via INTRAVENOUS

## 2015-12-21 MED ORDER — LIDOCAINE 2% (20 MG/ML) 5 ML SYRINGE
INTRAMUSCULAR | Status: AC
  Filled 2015-12-21: qty 5

## 2015-12-21 MED ORDER — FENTANYL CITRATE (PF) 100 MCG/2ML IJ SOLN
100.0000 ug | Freq: Once | INTRAMUSCULAR | Status: AC
  Administered 2015-12-21: 100 ug via INTRAVENOUS

## 2015-12-21 MED ORDER — OXYCODONE HCL 5 MG PO TABS
ORAL_TABLET | ORAL | Status: AC
  Filled 2015-12-21: qty 2

## 2015-12-21 MED ORDER — FENTANYL CITRATE (PF) 100 MCG/2ML IJ SOLN
INTRAMUSCULAR | Status: DC | PRN
  Administered 2015-12-21: 25 ug via INTRAVENOUS

## 2015-12-21 SURGICAL SUPPLY — 52 items
ADH SKN CLS APL DERMABOND .7 (GAUZE/BANDAGES/DRESSINGS) ×1
APPLIER CLIP 9.375 MED OPEN (MISCELLANEOUS) ×2
APR CLP MED 9.3 20 MLT OPN (MISCELLANEOUS) ×1
BINDER BREAST LRG (GAUZE/BANDAGES/DRESSINGS) IMPLANT
BINDER BREAST XLRG (GAUZE/BANDAGES/DRESSINGS) ×1 IMPLANT
BLADE SURG 15 STRL LF DISP TIS (BLADE) ×1 IMPLANT
BLADE SURG 15 STRL SS (BLADE) ×2
CANISTER SUCTION 2500CC (MISCELLANEOUS) ×2 IMPLANT
CHLORAPREP W/TINT 26ML (MISCELLANEOUS) ×2 IMPLANT
CLIP APPLIE 9.375 MED OPEN (MISCELLANEOUS) ×1 IMPLANT
COVER PROBE W GEL 5X96 (DRAPES) ×2 IMPLANT
COVER SURGICAL LIGHT HANDLE (MISCELLANEOUS) ×2 IMPLANT
DERMABOND ADVANCED (GAUZE/BANDAGES/DRESSINGS) ×1
DERMABOND ADVANCED .7 DNX12 (GAUZE/BANDAGES/DRESSINGS) ×1 IMPLANT
DEVICE DUBIN SPECIMEN MAMMOGRA (MISCELLANEOUS) ×2 IMPLANT
DRAPE CHEST BREAST 15X10 FENES (DRAPES) ×2 IMPLANT
DRAPE UTILITY XL STRL (DRAPES) ×2 IMPLANT
ELECT COATED BLADE 2.86 ST (ELECTRODE) ×2 IMPLANT
ELECT REM PT RETURN 9FT ADLT (ELECTROSURGICAL) ×2
ELECTRODE REM PT RTRN 9FT ADLT (ELECTROSURGICAL) ×1 IMPLANT
GLOVE BIO SURGEON STRL SZ7 (GLOVE) ×2 IMPLANT
GLOVE BIOGEL PI IND STRL 7.5 (GLOVE) ×1 IMPLANT
GLOVE BIOGEL PI INDICATOR 7.5 (GLOVE) ×1
GOWN STRL REUS W/ TWL LRG LVL3 (GOWN DISPOSABLE) ×2 IMPLANT
GOWN STRL REUS W/TWL LRG LVL3 (GOWN DISPOSABLE) ×4
ILLUMINATOR WAVEGUIDE N/F (MISCELLANEOUS) ×1 IMPLANT
KIT BASIN OR (CUSTOM PROCEDURE TRAY) ×2 IMPLANT
KIT MARKER MARGIN INK (KITS) ×2 IMPLANT
MARKER SKIN DUAL TIP RULER LAB (MISCELLANEOUS) ×2 IMPLANT
NDL HYPO 25X1 1.5 SAFETY (NEEDLE) ×1 IMPLANT
NDL SAFETY ECLIPSE 18X1.5 (NEEDLE) IMPLANT
NEEDLE FILTER BLUNT 18X 1/2SAF (NEEDLE)
NEEDLE FILTER BLUNT 18X1 1/2 (NEEDLE) IMPLANT
NEEDLE HYPO 18GX1.5 SHARP (NEEDLE)
NEEDLE HYPO 25X1 1.5 SAFETY (NEEDLE) ×2 IMPLANT
NS IRRIG 1000ML POUR BTL (IV SOLUTION) ×2 IMPLANT
PACK SURGICAL SETUP 50X90 (CUSTOM PROCEDURE TRAY) ×2 IMPLANT
PENCIL BUTTON HOLSTER BLD 10FT (ELECTRODE) ×2 IMPLANT
SPONGE LAP 18X18 X RAY DECT (DISPOSABLE) ×2 IMPLANT
STRIP CLOSURE SKIN 1/2X4 (GAUZE/BANDAGES/DRESSINGS) ×2 IMPLANT
SUT MNCRL AB 4-0 PS2 18 (SUTURE) ×4 IMPLANT
SUT SILK 2 0 SH (SUTURE) IMPLANT
SUT VIC AB 2-0 SH 27 (SUTURE) ×4
SUT VIC AB 2-0 SH 27XBRD (SUTURE) ×2 IMPLANT
SUT VIC AB 3-0 SH 27 (SUTURE) ×4
SUT VIC AB 3-0 SH 27X BRD (SUTURE) ×2 IMPLANT
SYR BULB 3OZ (MISCELLANEOUS) ×2 IMPLANT
SYR CONTROL 10ML LL (SYRINGE) ×2 IMPLANT
TOWEL OR 17X24 6PK STRL BLUE (TOWEL DISPOSABLE) ×2 IMPLANT
TOWEL OR 17X26 10 PK STRL BLUE (TOWEL DISPOSABLE) ×2 IMPLANT
TUBE CONNECTING 12X1/4 (SUCTIONS) ×2 IMPLANT
YANKAUER SUCT BULB TIP NO VENT (SUCTIONS) ×2 IMPLANT

## 2015-12-21 NOTE — Discharge Instructions (Signed)
[facility] °Office Phone Number [phone] °POST OP INSTRUCTIONS ° °Always review your discharge instruction sheet given to you by the facility where your surgery was performed. ° °IF YOU HAVE DISABILITY OR FAMILY LEAVE FORMS, YOU MUST BRING THEM TO THE OFFICE FOR PROCESSING.  DO NOT GIVE THEM TO YOUR DOCTOR. ° °1. A prescription for pain medication may be given to you upon discharge.  Take your pain medication as prescribed, if needed.  If narcotic pain medicine is not needed, then you may take acetaminophen (Tylenol), naprosyn (Alleve) or ibuprofen (Advil) as needed. °2. Take your usually prescribed medications unless otherwise directed °3. If you need a refill on your pain medication, please contact your pharmacy.  They will contact our office to request authorization.  Prescriptions will not be filled after 5pm or on week-ends. °4. You should eat very light the first 24 hours after surgery, such as soup, crackers, pudding, etc.  Resume your normal diet the day after surgery. °5. Most patients will experience some swelling and bruising in the breast.  Ice packs and a good support bra will help.  Wear the breast binder provided or a sports bra for 72 hours day and night.  After that wear a sports bra during the day until you return to the office. Swelling and bruising can take several days to resolve.  °6. It is common to experience some constipation if taking pain medication after surgery.  Increasing fluid intake and taking a stool softener will usually help or prevent this problem from occurring.  A mild laxative (Milk of Magnesia or Miralax) should be taken according to package directions if there are no bowel movements after 48 hours. °7. Unless discharge instructions indicate otherwise, you may remove your bandages 48 hours after surgery and you may shower at that time.  You may have steri-strips (small skin tapes) in place directly over the incision.  These strips should be left on the  skin for 7-10 days and will come off on their own.  If your surgeon used skin glue on the incision, you may shower in 24 hours.  The glue will flake off over the next 2-3 weeks.  Any sutures or staples will be removed at the office during your follow-up visit. °8. ACTIVITIES:  You may resume regular daily activities (gradually increasing) beginning the next day.  Wearing a good support bra or sports bra minimizes pain and swelling.  You may have sexual intercourse when it is comfortable. °a. You may drive when you no longer are taking prescription pain medication, you can comfortably wear a seatbelt, and you can safely maneuver your car and apply brakes. °b. RETURN TO WORK:  ______________________________________________________________________________________ °9. You should see your doctor in the office for a follow-up appointment approximately two weeks after your surgery.  Your doctor’s nurse will typically make your follow-up appointment when she calls you with your pathology report.  Expect your pathology report 3-4 business days after your surgery.  You may call to check if you do not hear from us after three days. °10. OTHER INSTRUCTIONS: _______________________________________________________________________________________________ _____________________________________________________________________________________________________________________________________ °_____________________________________________________________________________________________________________________________________ °_____________________________________________________________________________________________________________________________________ ° °WHEN TO CALL DR Elijan Googe: °1. Fever over 101.0 °2. Nausea and/or vomiting. °3. Extreme swelling or bruising. °4. Continued bleeding from incision. °5. Increased pain, redness, or drainage from the incision. ° °The clinic staff is available to answer your questions during regular  business hours.  Please don’t hesitate to call and ask to speak to one of the nurses for clinical concerns.  If you   have a medical emergency, go to the nearest emergency room or call 911.  A surgeon from [facility] is always on call at the hospital. ° °For further questions, please visit [email or web address] ° °

## 2015-12-21 NOTE — H&P (Signed)
59 yof referred by Dr Harlan Stains for newly diagnosed left breast cancer. she has prior history of benign left breast excision in the past. she is here with her husband today. she works IT trainer at [employer]. she had screening mm that shows a left breast mass that is uoq distortion that is hard to tell size. she has no [place] correlate. she underwent stereo biopsy. has axillary [place] that is negative. her biopsy of breast lesion is a grade I IDC with ADH that is er pos at 8, pr pos at 100, her2 negative and Ki is 5%. she has tender breast tissue but no mass or discharge that she notes. she has family history in a cousing with breast cancer in 57s. she lives in [place]. she has stopped estradiol. [facility] to discuss options.  Other Problems Tawni Pummel, RN; 12/15/2015 7:54 AM) Breast Cancer  Gastroesophageal Reflux Disease  Heart murmur  Hemorrhoids  Kidney Stone  Lump In Breast  Oophorectomy   Past Surgical History Tawni Pummel, RN; 12/15/2015 7:54 AM) Breast Biopsy  Left. Colon Polyp Removal - Colonoscopy  Hysterectomy (not due to cancer) - Complete  Oral Surgery   Diagnostic Studies History Tawni Pummel, RN; 12/15/2015 7:54 AM) Colonoscopy  1-5 years ago Mammogram  within last year Pap Smear  1-5 years ago  Medication History Tawni Pummel, RN; 12/15/2015 7:54 AM) Medications Reconciled  Social History Tawni Pummel, RN; 12/15/2015 7:54 AM) Alcohol use  Occasional alcohol use. Caffeine use  Carbonated beverages, Coffee, Tea. No drug use  Tobacco use  Never smoker.  Family History Tawni Pummel, RN; 12/15/2015 7:54 AM) Alcohol Abuse  Family Members In General. Breast Cancer  Family Members In [place] Members In [place]  Father. Kidney Disease  Mother. Respiratory Condition  Mother. Thyroid problems  Mother.  Pregnancy / Birth History Tawni Pummel, RN; 12/15/2015 7:54 AM) Age at  menarche  63 years. Age of menopause  [age] Contraceptive History  Oral contraceptives.   Review of Systems Sunday Spillers Ledford RN; 12/15/2015 7:54 AM) General Present- Weight Gain. Not Present- Appetite Loss, Chills, Fatigue, Fever, Night Sweats and Weight Loss. Skin Not Present- Change in Wart/Mole, Dryness, Hives, Jaundice, New Lesions, Non-Healing Wounds, Rash and Ulcer. HEENT Present- Hoarseness, Sinus Pain and Wears glasses/contact lenses. Not Present- Earache, Hearing Loss, Nose Bleed, Oral Ulcers, Ringing in the Ears, Seasonal Allergies, Sore Throat, Visual Disturbances and Yellow Eyes. Respiratory Present- Snoring. Not Present- Bloody sputum, Chronic Cough, Difficulty Breathing and Wheezing. Breast Not Present- Breast Mass, Breast Pain, Nipple Discharge and Skin Changes. Cardiovascular Not Present- Chest Pain, Difficulty Breathing Lying Down, Leg Cramps, Palpitations, Rapid Heart Rate, Shortness of Breath and Swelling of Extremities. Gastrointestinal Present- Hemorrhoids. Not Present- Abdominal Pain, Bloating, Bloody Stool, Change in Bowel Habits, Chronic diarrhea, Constipation, Difficulty Swallowing, Excessive gas, Gets full quickly at meals, Indigestion, Nausea, Rectal Pain and Vomiting. Female Genitourinary Not Present- Frequency, Nocturia, Painful Urination, Pelvic Pain and Urgency. Musculoskeletal Not Present- Back Pain, Joint Pain, Joint Stiffness, Muscle Pain, Muscle Weakness and Swelling of Extremities. Neurological Not Present- Decreased Memory, Fainting, Headaches, Numbness, Seizures, Tingling, Tremor, Trouble walking and Weakness. Psychiatric Not Present- Anxiety, Bipolar, Change in Sleep Pattern, Depression, Fearful and Frequent crying. Endocrine Not Present- Cold Intolerance, Excessive Hunger, Hair Changes, Heat Intolerance, Hot flashes and New Diabetes. Hematology Not Present- Blood Thinners, Easy Bruising, Excessive bleeding, Gland problems, HIV and Persistent  Infections.   Physical Exam Rolm Bookbinder MD; 12/15/2015 2:05 PM) General Mental  Status-Alert. Orientation-Oriented X3.  Eye Sclera/Conjunctiva - Bilateral-No scleral icterus.  Chest and Lung Exam Chest and lung exam reveals -on auscultation, normal breath sounds, no adventitious sounds and normal vocal resonance.  Breast Nipples-No Discharge. Breast Lump-No Palpable Breast Mass.  Cardiovascular Cardiovascular examination reveals -normal heart sounds, regular rate and rhythm with no murmurs.  Lymphatic Head & Neck  General Head & Neck Lymphatics: Bilateral - Description - Normal. Axillary  General Axillary Region: Bilateral - Description - Normal. Note: no Wright adenopathy     Assessment & Plan Rolm Bookbinder MD; 12/15/2015 2:12 PM) BREAST CANCER OF UPPER-OUTER QUADRANT OF LEFT FEMALE BREAST (C50.412) Story: Likely left breast seed guided lumpectomy and left axillary sn biopsy will get mri prior to surgery as this is vague area and she has c density breast tissue. she is agreeable after that to [facility]: Lambert Rhonda W. this is what we discussed today. We discussed the staging and pathophysiology of breast cancer. We discussed all of the different options for treatment for breast cancer including surgery, chemotherapy, radiation therapy, Herceptin, and antiestrogen therapy. We discussed a sentinel lymph node biopsy as she does not appear to having lymph node involvement right now. We discussed the performance of that with injection of radioactive tracer. We discussed that she would have an incision underneath her axillary hairline or would be done through the same incision. We discussed that there is a chance of having a positive node with a sentinel lymph node biopsy and we will await the permanent pathology to make any other first further decisions in terms of her treatment. We discussed up to a 5% risk lifetime of chronic shoulder pain as well as lymphedema associated with a  sentinel lymph node biopsy. We discussed the options for treatment of the breast cancer which included lumpectomy versus a mastectomy. We discussed the performance of the lumpectomy with radioactive seed placement. We discussed a 5-10% chance of a positive margin requiring reexcision in the operating room. We also discussed that she will likely need radiation therapy (this is usually 5-7 weeks) if she undergoes lumpectomy. We discussed the mastectomy (removal of whole breast) and the postoperative care for that as well. Mastectomy can be followed by reconstruction. The decision for lumpectomy vs mastectomy has no impact on decision for chemotherapy. Most mastectomy patients will not need radiation therapy. We discussed that there is no difference in her survival whether she undergoes lumpectomy with radiation therapy or antiestrogen therapy versus a mastectomy.  She has undergone mri that is negative except for solitary mass that has been biopsied and largest dimension is 1.4 cm. She had another small area near that US shows a normal im node.

## 2015-12-21 NOTE — Anesthesia Procedure Notes (Signed)
Procedure Name: LMA Insertion Date/Time: 12/21/2015 12:45 PM Performed by: Rush Farmer E Pre-anesthesia Checklist: Patient identified, Emergency Drugs available, Suction available and Patient being monitored Patient Re-evaluated:Patient Re-evaluated prior to inductionOxygen Delivery Method: Circle system utilized Preoxygenation: Pre-oxygenation with 100% oxygen Intubation Type: IV induction LMA: LMA inserted LMA Size: 4.0 Number of attempts: 1 Placement Confirmation: positive ETCO2 and breath sounds checked- equal and bilateral Tube secured with: Tape Dental Injury: Teeth and Oropharynx as per pre-operative assessment

## 2015-12-21 NOTE — Op Note (Signed)
Preoperative diagnosis: Left breast cancer, clinical stage I Postoperative diagnosis: same as above Procedure: 1. Left breast seed guided lumpectomy 2. Left axillary sentinel nodebiopsy Surgeon Dr Serita Grammes Anes general  EBL: minimal Comps none Specimen:  1. Left breast marked with paint 2. Left axillary sentinel nodes with highest count 245 3. Additional medial, superior and inferior margins marked short superior, long lateral double deep Sponge count correct at completion Dispoto recovery stable  Indications: This is a 19 yof who [facility] to [facility] with newly diagnosed left breast cancer.  This ends up being a clinical stage I breast cancer and we discussed options. She has elected to proceed with seed guided lumpectomy and sn biopsy.  A seed was placed prior to beginning and I had her mm in the room.  Procedure: After informed consent was obtained the patient was taken to the OR. She was injected with technetium in the standard periareolar fashion. She was given anitibiotics. SCDs were in place. She was prepped and draped in the standard sterile surgical fashion. A timeout was performed. She had undergone a pectoral block. I then located the seed in axilla on the left side was the cancer was in the uoq. I made an incision near the axilla. I used the lighted retractors to tunnel to the lesion in an attempt to hide her scar. I then removed the seedwith an attempt to get a clear margin. This waspassed off the table after marking with paint. I did confirm removal of theclipsand seed with mammography.I did remove some additional margins as above and they were marked.  I placed clips in the cavity. The posterior margin is the muscle.  I obtained hemostasis. Through the same incision I went through the axillary fascia. I identified the sentinel nodes with the highest count as above.  There was no gross nodal disease.  There was no background radioactivity. I obtained hemostasis.  I then closed the fascia with 2-0 vicryl. I closed the breast tissue down with 2-0 vicryl as well. I closed the skin with 3-0 vicryl and 4-0 monocryl. Glue and steristrips were placed A breast binder was placed. She was extubated and transferred to recovery stable

## 2015-12-21 NOTE — Transfer of Care (Signed)
Immediate Anesthesia Transfer of Care Note  Patient: Laurie Calhoun  Procedure(s) Performed: Procedure(s): LEFT BREAST LUMPECTOMY WITH RADIOACTIVE SEED AND SENTINEL LYMPH NODE BIOPSY (Left)  Patient Location: PACU  Anesthesia Type:GA combined with regional for post-op pain  Level of Consciousness: awake, alert  and oriented  Airway & Oxygen Therapy: Patient Spontanous Breathing and Patient connected to nasal cannula oxygen  Post-op Assessment: Report given to RN, Post -op Vital signs reviewed and stable and Patient moving all extremities  Post vital signs: Reviewed and stable  Last Vitals:  Vitals:   12/21/15 1223 12/21/15 1404  BP:  126/66  Pulse: 78 81  Resp: 15 14  Temp:      Last Pain:  Vitals:   12/21/15 1018  TempSrc: Oral      Patients Stated Pain Goal: 6 ([id number])  Complications: No apparent anesthesia complications

## 2015-12-21 NOTE — Anesthesia Preprocedure Evaluation (Addendum)
Anesthesia Evaluation  Patient identified by MRN, date of birth, ID band Patient awake  General Assessment Comment:History noted. CG  Reviewed: Allergy & Precautions, NPO status   Airway Mallampati: II  TM Distance: >3 FB     Dental   Pulmonary neg pulmonary ROS,    breath sounds clear to auscultation       Cardiovascular negative cardio ROS   Rhythm:Regular Rate:Normal     Neuro/Psych negative neurological ROS     GI/Hepatic Neg liver ROS, GERD  ,  Endo/Other  negative endocrine ROS  Renal/GU negative Renal ROS     Musculoskeletal   Abdominal   Peds  Hematology   Anesthesia Other Findings   Reproductive/Obstetrics                             Anesthesia Physical Anesthesia Plan  ASA: II  Anesthesia Plan: General   Post-op Pain Management:    Induction: Intravenous  Airway Management Planned: LMA  Additional Equipment:   Intra-op Plan:   Post-operative Plan: Extubation in OR  Informed Consent: I have reviewed the patients History and Physical, chart, labs and discussed the procedure including the risks, benefits and alternatives for the proposed anesthesia with the patient or authorized representative who has indicated his/her understanding and acceptance.   Dental advisory given  Plan Discussed with: CRNA  Anesthesia Plan Comments:         Anesthesia Quick Evaluation

## 2015-12-21 NOTE — Anesthesia Postprocedure Evaluation (Signed)
Anesthesia Post Note  Patient: Laurie Calhoun  Procedure(s) Performed: Procedure(s) (LRB): LEFT BREAST LUMPECTOMY WITH RADIOACTIVE SEED AND SENTINEL LYMPH NODE BIOPSY (Left)  Patient location during evaluation: PACU Anesthesia Type: General Level of consciousness: awake Pain management: pain level controlled Vital Signs Assessment: post-procedure vital signs reviewed and stable Cardiovascular status: stable Anesthetic complications: no    Last Vitals:  Vitals:   12/21/15 1223 12/21/15 1404  BP:  126/66  Pulse: 78 81  Resp: 15 14  Temp:  36.2 C    Last Pain:  Vitals:   12/21/15 1018  TempSrc: Oral                 Ally Knodel

## 2015-12-21 NOTE — Anesthesia Procedure Notes (Signed)
Anesthesia Regional Block:  Pectoralis block  Pre-Anesthetic Checklist: ,, timeout performed, Correct Patient, Correct Site, Correct Laterality, Correct Procedure,, site marked, risks and benefits discussed, Surgical consent,  Pre-op evaluation,  At surgeon's request and post-op pain management  Laterality: Left     Needles:   Needle Type: Stimulator Needle - 40          Additional Needles:  Procedures: Doppler guided and ultrasound guided (picture in chart) Pectoralis block Narrative:  Start time: 12/21/2015 11:35 AM End time: 12/21/2015 11:55 AM Injection made incrementally with aspirations every 5 mL.  Performed by: Personally  Anesthesiologist: Vollie Aaron  Additional Notes: 20cc 0.5%maraine with epi 1:200,000.neg asp Pt tolerated procedure well. No difficulties.

## 2015-12-21 NOTE — Interval H&P Note (Signed)
History and Physical Interval Note:  12/21/2015 11:37 AM  Laurie Calhoun  has presented today for surgery, with the diagnosis of LEFT BREAST CANCER  The various methods of treatment have been discussed with the patient and family. After consideration of risks, benefits and other options for treatment, the patient has consented to  Procedure(s): LEFT BREAST LUMPECTOMY WITH RADIOACTIVE SEED AND SENTINEL LYMPH NODE BIOPSY (Left) as a surgical intervention .  The patient's history has been reviewed, patient examined, no change in status, stable for surgery.  I have reviewed the patient's chart and labs.  Questions were answered to the patient's satisfaction.     Mlissa Tamayo

## 2015-12-21 NOTE — Anesthesia Procedure Notes (Signed)
Anesthesia Procedure Note     

## 2015-12-22 ENCOUNTER — Encounter (HOSPITAL_COMMUNITY): Payer: Self-pay | Admitting: General Surgery

## 2015-12-24 ENCOUNTER — Telehealth: Payer: Self-pay | Admitting: *Deleted

## 2015-12-24 NOTE — Telephone Encounter (Signed)
Ordered oncotype per Dr. Feng.  Faxed requisition to pathology and confirmed receipt.  

## 2016-01-07 ENCOUNTER — Encounter (HOSPITAL_COMMUNITY): Payer: Self-pay

## 2016-01-07 ENCOUNTER — Telehealth: Payer: Self-pay | Admitting: *Deleted

## 2016-01-07 NOTE — Telephone Encounter (Signed)
Received oncotype results of 8.  Left message for a return phone call to inform patient.

## 2016-01-13 ENCOUNTER — Encounter: Payer: Self-pay | Admitting: Radiation Oncology

## 2016-01-13 NOTE — Progress Notes (Signed)
````````````````````````````````````````````````````````````````````````````````````````````````````````````````````````````````````````````````````````````````````````````````````````````````````````````````````````````````````````````````````````````````````````````````````````````````````````````````````````````````````````````````````````````````````````````````````````````````````````````  Location of Breast Cancer: Left Breast,Upper Outer Quadrant  Histology per Pathology Report: Diagnosis :12/01/2015  Breast, left, needle core biopsy, upper outer- INVASIVE DUCTAL CARCINOMA.- ATYPICAL DUCTAL HYPERPLASIA  Receptor Status: ER(90%+), PR (100%+), Her2-neu neg ratio=1.05(), Ki-67(5%)  Did patient present with symptoms (if so, please note symptoms) or was this found on screening mammography?: found on screening mammogram  Past/Anticipated interventions by surgeon, if [id number] : 12/21/2015 : Dr. Darleen Crocker 1. Breast, lumpectomy, Left - INVASIVE DUCTAL CARCINOMA WITH CALCIFICATIONS, GRADE I/III, SPANNING 1.4 CM. - DUCTAL CARCINOMA IN SITU WITH CALCIFICATIONS, LOW GRADE.- THE SURGICAL RESECTION MARGINS ARE NEGATIVE FOR CARCINOMA.- SEE ONCOLOGY TABLE BELOW. 2. Lymph node, sentinel, biopsy, Left axillary - THERE IS NO EVIDENCE OF CARCINOMA IN 1 OF 1 LYMPH NODE (0/1). 3. Lymph node, sentinel, biopsy, Left axillary - THERE IS NO EVIDENCE OF CARCINOMA IN 1 OF 1 LYMPH NODE (0/1). 4. Breast, excision, Left superior margin - BENIGN ADIPOSE TISSUE.- THERE IS NO EVIDENCE OF MALIGNANCY.- SEE COMMENT., 5. Breast, excision, Left inferior margin - FIBROCYSTIC CHANGES- THERE IS NO EVIDENCE OF MALIGNANCY.- SEE COMMENT. 6. Breast, excision, Left medial margin - FIBROCYSTIC CHANGES- THERE IS NO EVIDENCE OF MALIGNANCY  Past/Anticipated interventions by medical oncology, if any: Chemotherapy : Oncotype results=8  Lymphedema issues, if any: No. Began physical therapy yesterday. Scheduled once a week  for three weeks.  Pain issues, if any:  Denies   SAFETY ISSUES:  Prior radiation? NO  Pacemaker/ICD? NO  Possible current pregnancy? NO  Is the patient on methotrexate? NO  Current Complaints / other details:  Maried, menarche age 73,G1P0, 2 adopted children, HRT 51 years,quit 11/2015, Mother lung cancer, Father GI cancer, cousin breast ca AllergiesPCNS    Rebecca Eaton, RN 01/13/2016,12:56 PM

## 2016-01-17 ENCOUNTER — Ambulatory Visit: Payer: Managed Care, Other (non HMO) | Attending: General Surgery | Admitting: Physical Therapy

## 2016-01-17 ENCOUNTER — Encounter: Payer: Self-pay | Admitting: Physical Therapy

## 2016-01-17 DIAGNOSIS — M25512 Pain in left shoulder: Secondary | ICD-10-CM | POA: Diagnosis not present

## 2016-01-17 DIAGNOSIS — M25612 Stiffness of left shoulder, not elsewhere classified: Secondary | ICD-10-CM | POA: Diagnosis present

## 2016-01-17 NOTE — Therapy (Signed)
[place], [place], [address] Phone: [phone]   Fax:  [phone]  Physical Therapy Evaluation  Patient Details  Name: Laurie Calhoun MRN: [id number] Date of Birth: 11-23-1955 Referring Provider: Dr. Rolm Bookbinder  Encounter Date: 01/17/2016      PT End of Session - 01/17/16 1429    Visit Number 1   Number of Visits 5   Date for PT Re-Evaluation 02/14/16   PT Start Time 1350   PT Stop Time 1426   PT Time Calculation (min) 36 min   Activity Tolerance Patient tolerated treatment well   Behavior During Therapy [facility] for tasks assessed/performed      Past Medical History:  Diagnosis Date  . Allergy   . Breast cancer ([place]) 12/01/2015   Left Breast  . GERD (gastroesophageal reflux disease)   . 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 History of kidney stones   . 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 ABDOMINAL HYSTERECTOMY    . BREAST LUMPECTOMY WITH RADIOACTIVE SEED AND SENTINEL LYMPH NODE BIOPSY Left 12/21/2015   Procedure: LEFT BREAST LUMPECTOMY WITH RADIOACTIVE SEED AND SENTINEL LYMPH NODE BIOPSY;  Surgeon: Rolm Bookbinder, MD;  Location: [facility];  Service: General;  Laterality: Left;  . BREAST SURGERY     breast lumpectomy ?2000   . COLONOSCOPY    . PILONIDAL CYST EXCISION      There were no vitals filed for this visit.       Subjective Assessment - 01/17/16 1353    Subjective I feel like my arm is moving just fine. I just have a pain in my left arm pit. I have been doing all of the ROM exercises I was given.    Pertinent History Patient was diagnosed on 11/23/15 with left grade 1 invasive ductal carcinoma breast cancer. It is located in the upper outer quadrant and is a distortion so the size is not yet determined until she has an MRI. It is ER/PR positive and HER2 negative with a  ki67 of 5%., left lumpectomy and sentinel lymph node biopsy (2 or 3) 12/21/15   Patient Stated Goals reduce risk of lymphedema   Currently in Pain? No/denies            [facility] PT Assessment - 01/17/16 0001      Assessment   Medical Diagnosis Left breast cancer   Onset Date/Surgical Date 12/21/15   Hand Dominance Right   Prior Therapy none     Precautions   Precautions Other (comment)  at risk for lymphedema   Precaution Comments --     Restrictions   Weight Bearing Restrictions No     Balance Screen   Has the patient fallen in the past 6 months No   Has the patient had a decrease in activity level because of a fear of falling?  No   Is the patient reluctant to leave their home because of a fear of falling?  No     Home Ecologist residence   Living Arrangements Spouse/significant other   Available Help at Discharge Family   Type of [place] to enter   Entrance Stairs-Number of Steps 3   Entrance Stairs-Rails None   Home Layout Multi-level     Prior Function   Level of [place] Independent  Vocation Full time employment   Armed forces technical officer at [employer]   Leisure She does not exercise     Cognition   Overall Cognitive Status Within Functional Limits for tasks assessed     Posture/Postural Control   Posture/Postural Control Postural limitations   Postural Limitations Rounded Shoulders;Forward head     ROM / Strength   AROM / PROM / Strength AROM;Strength     AROM   AROM Assessment Site Shoulder   Right/Left Shoulder Right;Left   Right Shoulder Extension --   Right Shoulder Flexion 166 Degrees   Right Shoulder ABduction 167 Degrees   Right Shoulder Internal Rotation 70 Degrees   Right Shoulder External Rotation 80 Degrees   Left Shoulder Extension --   Left Shoulder Flexion 170 Degrees   Left Shoulder ABduction 173 Degrees   Left Shoulder Internal Rotation 74 Degrees   Left  Shoulder External Rotation 87 Degrees   Cervical Flexion WNL   Cervical Extension WNL   Cervical - Right Side Bend WNL   Cervical - Left Side Bend WNL   Cervical - Right Rotation WNL   Cervical - Left Rotation WNL     Strength   Overall Strength Within functional limits for tasks performed           LYMPHEDEMA/ONCOLOGY QUESTIONNAIRE - 01/17/16 1406      Type   Cancer Type Left breast cancer     Surgeries   Lumpectomy Date 12/21/15   Sentinel Lymph Node Biopsy Date 12/21/15   Number Lymph Nodes Removed 3     Treatment   Active Chemotherapy Treatment No   Past Chemotherapy Treatment No   Active Radiation Treatment --  pt is going to start radiation soon, consult is Wednesday   Past Radiation Treatment No   Current Hormone Treatment No   Past Hormone Therapy No     What other symptoms do you have   Are you Having Heaviness or Tightness No   Are you having Pain Yes   Are you having pitting edema No   Is it Hard or Difficult finding clothes that fit No   Do you have infections No     Right Upper Extremity Lymphedema   15 cm Proximal to Olecranon Process 29.5 cm   10 cm Proximal to Olecranon Process 27.5 cm   Olecranon Process 24.5 cm   15 cm Proximal to Ulnar Styloid Process 22.3 cm   10 cm Proximal to Ulnar Styloid Process 19 cm   Just Proximal to Ulnar Styloid Process 15.5 cm   Across Hand at [employer] 18.5 cm   At [place] of 2nd Digit 6.3 cm     Left Upper Extremity Lymphedema   15 cm Proximal to Olecranon Process 29.5 cm   10 cm Proximal to Olecranon Process 27 cm   Olecranon Process 24 cm   15 cm Proximal to Ulnar Styloid Process 22.7 cm   10 cm Proximal to Ulnar Styloid Process 19.5 cm   Just Proximal to Ulnar Styloid Process 15.5 cm   Across Hand at [employer] 19 cm   At [place] of 2nd Digit 6.5 cm           Quick Dash - 01/17/16 0001    Open a tight or new jar Moderate difficulty   Do heavy household chores (wash walls, wash floors) No  difficulty   Carry a shopping bag or briefcase No difficulty   Wash your back No difficulty  Use a knife to cut food No difficulty   Recreational activities in which you take some force or impact through your arm, shoulder, or hand (golf, hammering, tennis) No difficulty   During the past week, to what extent has your arm, shoulder or hand problem interfered with your normal social activities with family, friends, neighbors, or groups? Not at all   During the past week, to what extent has your arm, shoulder or hand problem limited your work or other regular daily activities Not at all   Arm, shoulder, or hand pain. Mild   Tingling (pins and needles) in your arm, shoulder, or hand None   Difficulty Sleeping No difficulty   DASH Score 6.82 %             [facility] Adult PT Treatment/Exercise - 01/17/16 0001      Manual Therapy   Manual Therapy Edema management   Edema Management educated pt in lymphedema risk reduction practices                PT Education - 01/17/16 1707    Education provided Yes   Education Details lymphedema risk reduction practices, anatomy and physiology of lymphatic system   Person(s) Educated Patient   Methods Explanation;Handout   Comprehension Verbalized understanding            [facility] Goals - 01/17/16 1708      CC Long Term Goal  #1   Title Patient will be able to independently verbalize lymphedema risk reduction practices    Time 4   Period Weeks   Status New     CC Long Term Goal  #2   Title Patient will report a 75% improvement in left axillary pain to allow improved comfort   Time 4   Period Weeks   Status New     CC Long Term Goal  #3   Title Patient will be independent in a home exercise program for continued stretching and strengthening so she does not loose ROM during radiation   Time 4   Period Weeks   Status New            Plan - 01/17/16 1429    Clinical Impression Statement Pt diagnosed on 11/23/15  with left grade 1 invasive ductal carcinoma breast cancer. She underwent a left lumpectomy with sentinel lymph node biopsy on 12/21/15. Pt has been performing post surgical shoulder ROM exercises since that time. She presents with full ROM of her left upper extremity but does have tightness in her left axilla. Palpation reveals scar tissue and tightness in left axilla. Pt would benefit from myofascial release to decrease discomfort in left axilla as well as a home exercise program since pt is going to begin radiation therapy and will be at risk for increased tightness. Pt also would benefit from further education regarding lymphedema risk reduction practices. This evaluation was of minimal complexity due to the lack of comorbidities and personal factors.    Rehab Potential Excellent   Clinical Impairments Affecting Rehab Potential none   PT Frequency 1x / week   PT Duration 4 weeks   PT Treatment/Interventions Therapeutic exercise;ADLs/Self Care Home Management;Patient/family education;Manual techniques;Scar mobilization;Passive range of motion   PT Next Visit Plan myofascial to left axilla, begin strengthening exercises for left shoulder   Consulted and Agree with Plan of Care Patient      Patient will benefit from skilled therapeutic intervention in order to improve the following deficits and impairments:  Decreased  knowledge of precautions, Increased fascial restricitons, Pain  Visit Diagnosis: Acute pain of left shoulder  Stiffness of left shoulder, not elsewhere classified     Problem List Patient Active Problem List   Diagnosis Date Noted  . Malignant neoplasm of upper-outer quadrant of left female breast ([place]) 12/07/2015    Allyson Sabal [facility] 01/17/2016, 5:12 PM  [place], [place], [address] Phone: [phone]   Fax:  [phone]  Name: Laurie Calhoun MRN: [id number] Date of Birth:  Mar 27, 1955  Manus Gunning, PT 01/17/16 5:12 PM

## 2016-01-19 ENCOUNTER — Telehealth: Payer: Self-pay | Admitting: Hematology

## 2016-01-19 ENCOUNTER — Ambulatory Visit
Admission: RE | Admit: 2016-01-19 | Discharge: 2016-01-19 | Disposition: A | Discharge status: Home / Self Care | Payer: Managed Care, Other (non HMO) | Source: Ambulatory Visit | Attending: Radiation Oncology | Admitting: Radiation Oncology

## 2016-01-19 ENCOUNTER — Encounter: Payer: Self-pay | Admitting: Radiation Oncology

## 2016-01-19 VITALS — BP 126/69 | HR 77 | Resp 18 | Ht 66.0 in | Wt 184.6 lb

## 2016-01-19 DIAGNOSIS — Z800 Family history of malignant neoplasm of digestive organs: Secondary | ICD-10-CM | POA: Diagnosis not present

## 2016-01-19 DIAGNOSIS — C50412 Malignant neoplasm of upper-outer quadrant of left female breast: Secondary | ICD-10-CM

## 2016-01-19 DIAGNOSIS — Z801 Family history of malignant neoplasm of trachea, bronchus and lung: Secondary | ICD-10-CM | POA: Diagnosis not present

## 2016-01-19 DIAGNOSIS — Z170 Estrogen receptor positive status [ER+]: Secondary | ICD-10-CM | POA: Insufficient documentation

## 2016-01-19 DIAGNOSIS — Z79899 Other long term (current) drug therapy: Secondary | ICD-10-CM | POA: Insufficient documentation

## 2016-01-19 DIAGNOSIS — Z87442 Personal history of urinary calculi: Secondary | ICD-10-CM | POA: Insufficient documentation

## 2016-01-19 DIAGNOSIS — Z510 Encounter for antineoplastic radiation therapy: Secondary | ICD-10-CM | POA: Diagnosis present

## 2016-01-19 DIAGNOSIS — K219 Gastro-esophageal reflux disease without esophagitis: Secondary | ICD-10-CM | POA: Insufficient documentation

## 2016-01-19 DIAGNOSIS — Z880 Allergy status to penicillin: Secondary | ICD-10-CM | POA: Insufficient documentation

## 2016-01-19 HISTORY — DX: Allergy, unspecified, initial encounter: T78.40XA

## 2016-01-19 HISTORY — DX: Malignant neoplasm of unspecified site of unspecified female breast: C50.919

## 2016-01-19 NOTE — Progress Notes (Signed)
Radiation Oncology         [phone] ________________________________  Name: Laurie Calhoun MRN: [id number]  Date: 01/19/2016  DOB: 12/10/1955  OA:CZYSA,YTKZSWF S, MD  Truitt Merle, MD     REFERRING PHYSICIAN: Truitt Merle, MD   DIAGNOSIS: The encounter diagnosis was Malignant neoplasm of upper-outer quadrant of left breast in female, estrogen receptor positive ([place]).   HISTORY OF PRESENT ILLNESS: Laurie Calhoun is a 61 y.o. female originally seen in the multidisciplinary breast clinic for a new diagnosis of left breast cancer. The patient has been followed with screening mammograms and on 11/23/15 a screening exam revealed a possible left breast mass. No right sided lesions were seen. She had diagnostic imaging on 11/30/15 including ultrasound which revealed persistent left sided distortion without defitinative ultrasound correlate, and no axillary adenopathy. 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 She was counseled on breast conservation surgery and underwent left lumpectomy with radioactive seed placment and sentinel node evaluation. Final pathology revealed grade 1 invasive ductal carcinoma with DCIS, negative margins, and negative notes. The tumor measured 1.4 cm. Her tumor was tested for oncotype and her score was low risk at 8. She will not receive chemotherapy. She comes today to discuss the role of adjuvant radiotherapy.  PREVIOUS RADIATION THERAPY: No   PAST MEDICAL HISTORY:  Past Medical History:  Diagnosis Date  . Allergy   . Breast cancer ([place]) 12/01/2015   Left Breast  . GERD (gastroesophageal reflux disease)   . 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 History of kidney stones   . Malignant neoplasm of upper-outer quadrant of left female breast ([place]) 12/07/2015     Of note, the patient was age 67 at menarche. She was on HRT for 15 years, and stopped 11/2015. Her family history is  significant for mother with lung cancer, father with GI cancer, and a cousin with breast cancer.   PAST SURGICAL HISTORY: Past Surgical History:  Procedure Laterality Date  . ABDOMINAL HYSTERECTOMY    . BREAST LUMPECTOMY WITH RADIOACTIVE SEED AND SENTINEL LYMPH NODE BIOPSY Left 12/21/2015   Procedure: LEFT BREAST LUMPECTOMY WITH RADIOACTIVE SEED AND SENTINEL LYMPH NODE BIOPSY;  Surgeon: Rolm Bookbinder, MD;  Location: [facility];  Service: General;  Laterality: Left;  . BREAST SURGERY     breast lumpectomy ?2000   . COLONOSCOPY    . PILONIDAL CYST EXCISION       FAMILY HISTORY:  Family History  Problem Relation Age of Onset  . Lung cancer Mother   . Cancer Mother     lung cancer  . Cancer Father     GI cancer   . Cancer Cousin 60    breast cancer      SOCIAL HISTORY:  reports that she has never smoked. She has never used smokeless tobacco. She reports that she drinks about 1.2 oz of alcohol per week . She reports that she does not use drugs. The patient is married and lives in [place]. She works at [employer].   ALLERGIES: Penicillins   MEDICATIONS:  Current Outpatient Prescriptions  Medication Sig Dispense Refill  . diphenhydramine-acetaminophen (TYLENOL PM) 25-500 MG TABS tablet Take 2 tablets by mouth at bedtime as needed (for sleep.).    Marland Kitchen Multiple Vitamins-Minerals (CENTRUM ADULTS PO) Take by mouth.    . Vitamin D, Ergocalciferol, (DRISDOL) 50000 units CAPS capsule Take 50,000 Units by mouth every Monday.  No current facility-administered medications for this encounter.      REVIEW OF SYSTEMS: On review of systems, the patient reports that she is doing well overall. She denies any chest pain, shortness of breath, cough, fevers, chills, night sweats, unintended weight changes. She denies any bowel or bladder disturbances, and denies abdominal pain, nausea or vomiting. She denies any new musculoskeletal or joint aches or pains. A complete review of  systems is obtained and is otherwise negative.    PHYSICAL EXAM:  Wt Readings from Last 3 Encounters:  01/19/16 184 lb 9.6 oz (83.7 kg)  12/15/15 181 lb 11.2 oz (82.4 kg)  12/09/14 165 lb (74.8 kg)   Temp Readings from Last 3 Encounters:  12/21/15 97.6 F (36.4 C)  12/15/15 98 F (36.7 C) (Oral)  12/09/14 98.3 F (36.8 C) (Oral)   BP Readings from Last 3 Encounters:  01/19/16 126/69  12/21/15 122/75  12/15/15 125/60   Pulse Readings from Last 3 Encounters:  01/19/16 77  12/21/15 65  12/15/15 87     Pain scale 0/10  In general this is a well appearing caucasian female in no acute distress. She's alert and oriented x4 and appropriate throughout the examination. Cardiopulmonary assessment is negative for acute distress and she exhibits normal effort. The left breast is intact with a well healing lumpectomy scar without separation, cellulitic change, or fluid accumulation.    ECOG = 0  0 - Asymptomatic (Fully active, able to carry on all predisease activities without restriction)  1 - Symptomatic but completely ambulatory (Restricted in physically strenuous activity but ambulatory and able to carry out work of a light or sedentary nature. For example, light housework, office work)  2 - Symptomatic, <50% in bed during the day (Ambulatory and capable of all self care but unable to carry out any work activities. Up and about more than 50% of waking hours)  3 - Symptomatic, >50% in bed, but not bedbound (Capable of only limited self-care, confined to bed or chair 50% or more of waking hours)  4 - Bedbound (Completely disabled. Cannot carry on any self-care. Totally confined to bed or chair)  5 - Death   Eustace Pen MM, Creech RH, Tormey DC, et al. [phone]). "Toxicity and response criteria of the [facility]". [place] Oncol. 5 (6): 649-55    LABORATORY DATA:  Lab Results  Component Value Date   WBC 4.9 12/15/2015   HGB 13.4 12/15/2015   HCT 40.4  12/15/2015   MCV 92.1 12/15/2015   PLT 302 12/15/2015   Lab Results  Component Value Date   NA 141 12/15/2015   K 4.0 12/15/2015   CL 110 12/09/2014   CO2 27 12/15/2015   Lab Results  Component Value Date   ALT 14 12/15/2015   AST 11 12/15/2015   ALKPHOS 70 12/15/2015   BILITOT 0.67 12/15/2015      RADIOGRAPHY: Nm Sentinel Node Inj-no Rpt (breast)  Result Date: 01/19/2016 Sulfur colloid was injected intradermally by the nuclear medicine technologist for breast cancer sentinel node localization  Mm Breast Surgical Specimen  Result Date: 12/21/2015 CLINICAL DATA:  Patient status post left breast lumpectomy. EXAM: SPECIMEN RADIOGRAPH OF THE LEFT BREAST COMPARISON:  Previous exam(s). FINDINGS: Status post excision of the left breast. The radioactive seed and biopsy marker clip are present, completely intact, and were marked for pathology. IMPRESSION: Specimen radiograph of the left breast. Electronically Signed   By: Lovey Newcomer M.D.   On: 12/21/2015 13:25  US Breast Ltd Uni Left Inc Axilla  Result Date: 12/21/2015 CLINICAL DATA:  5 mm oval enhancing mass 2 cm posterior to a biopsy-proven invasive ductal carcinoma and atypical ductal hyperplasia in the upper outer left breast on a recent staging MR. The mass had signal characteristics suggesting an intramammary lymph node. EXAM: ULTRASOUND OF THE RIGHT BREAST COMPARISON:  Previous exam(s). FINDINGS: On physical exam, there is an approximately 2 x 1.5 cm oval, faintly palpable mass in the upper outer left breast, corresponding to the recently biopsied invasive ductal carcinoma. No other masses are palpable. Targeted ultrasound is performed, showing a poorly defined area of focal shadowing in the upper outer left breast corresponding to the location of the recently biopsied invasive ductal carcinoma. Approximately 2 cm posterior to that area, a normal appearing intramammary lymph node is demonstrated. This is in the 2 o'clock position, 10  cm from the nipple, and measures 5 x 4 x 2 mm in maximum dimensions. This corresponds to the mass seen on the MR. IMPRESSION: 5 cm normal appearing intramammary lymph node in the 2 o'clock position of the left breast, corresponding to the mass seen on the recent MR. RECOMMENDATION: Treatment plan. I have discussed the findings and recommendations with the patient. Results were also provided in writing at the conclusion of the visit. If applicable, a reminder letter will be sent to the patient regarding the next appointment. BI-RADS CATEGORY  2: Benign. Electronically Signed   By: Claudie Revering M.D.   On: 12/21/2015 08:59   Mm Lt Radioactive Seed Loc Mammo Guide  Result Date: 12/21/2015 CLINICAL DATA:  Recently diagnosed invasive ductal carcinoma and atypical ductal hyperplasia in the upper-outer quadrant of the left breast. EXAM: MAMMOGRAPHIC GUIDED RADIOACTIVE SEED LOCALIZATION OF THE LEFT BREAST COMPARISON:  Previous exam(s). FINDINGS: Patient presents for radioactive seed localization prior to left lumpectomy. I met with the patient and we discussed the procedure of seed localization including benefits and alternatives. We discussed the high likelihood of a successful procedure. We discussed the risks of the procedure including infection, bleeding, tissue injury and further surgery. We discussed the low dose of radioactivity involved in the procedure. Informed, written consent was given. The usual time-out protocol was performed immediately prior to the procedure. Using mammographic guidance, sterile technique, 1% lidocaine and an I-125 radioactive seed, the ribbon shaped biopsy marker clip in the upper-outer quadrant of the left breast was localized using a cephalad approach. The follow-up mammogram images confirm the seed in the expected location and were marked for Dr. Donne Hazel. Follow-up survey of the patient confirms presence of the radioactive seed. Order number of I-125 seed:  [id number]. Total  activity:  2.993 millicurie  Reference Date: 11/24/2015 The patient tolerated the procedure well and was released from the [place]. She was given instructions regarding seed removal. IMPRESSION: Radioactive seed localization left breast. No apparent complications. Electronically Signed   By: Claudie Revering M.D.   On: 12/21/2015 09:02       IMPRESSION/PLAN: 1. 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 Dr. Lisbeth Renshaw discusses the surgical findings since our last visit. Her tumor has been tested with oncotype, and she does not have in indication for chemotherapy. We reviewed the recommendations now at this point to move forward with external radiotherapy to the breast followed by antiestrogen therapy. We discussed the risks, benefits, short, and long term effects of radiotherapy, and the patient is interested in proceeding.  Dr. Lisbeth Renshaw specifically outlines  the special considerations of left sided cancers, related to the heart, and discusses the utility of breath hold technique. He discusses the delivery and logistics of radiotherapy, and again recommends a course of 20 fractions of treatment over 4 weeks.  The patient is enthusiastic about proceeding with treatment.   A consent form was discussed, signed, and placed in the patient's chart.  She is scheduled for CT Simulation this morning with plans to begin radiation treatments next week.   In a visit lasting 25 minutes, greater than 50% of the time was spent face to face discussing her pathology and updating her on our recommendations as well as reiterating the delivery and logistics of treatment, and coordinating the patient's care.  The above documentation reflects my direct findings during this shared patient visit. Please see the separate note by Dr. Lisbeth Renshaw on this date for the remainder of the patient's plan of care.    Carola Rhine, PAC  This document serves as a record of services personally  performed by Kyung Rudd, MD and Shona Simpson, PA. It was created on his behalf by Maryla Morrow, a trained medical scribe. The creation of this record is based on the scribe's personal observations and the provider's statements to them. This document has been checked and approved by the attending provider.

## 2016-01-19 NOTE — Progress Notes (Signed)
See progress note under physician encounter. 

## 2016-01-19 NOTE — Progress Notes (Signed)
  Radiation Oncology         [phone]) [phone] ________________________________  Name: Laurie Calhoun MRN: [id number]  Date: 01/19/2016  DOB: 01-03-1956  Optical Surface Tracking Plan:  Since intensity modulated radiotherapy (IMRT) and 3D conformal radiation treatment methods are predicated on accurate and precise positioning for treatment, intrafraction motion monitoring is medically necessary to ensure accurate and safe treatment delivery.  The ability to quantify intrafraction motion without excessive ionizing radiation dose can only be performed with optical surface tracking. Accordingly, surface imaging offers the opportunity to obtain 3D measurements of patient position throughout IMRT and 3D treatments without excessive radiation exposure.  I am ordering optical surface tracking for this patient's upcoming course of radiotherapy. ________________________________  Kyung Rudd, MD 01/19/2016 12:32 PM    Reference:   Ursula Alert, J, et al. Surface imaging-based analysis of intrafraction motion for breast radiotherapy patients.Journal of [place], n. 6, nov. 2014. ISSN [id number].   Available at: <http://www.jacmp.org/index.php/jacmp/article/view/4957>.

## 2016-01-19 NOTE — Telephone Encounter (Signed)
Patient called re needing a follow up appointment after [facility] and surgery. Per patient she had not heard anything since initial visit. Called transferred to navigator for patient to leave message.

## 2016-01-19 NOTE — Telephone Encounter (Signed)
Varney Biles,   It looks like you left a message about her ONCOTYPE result on 12/29, not sure if she called you back. Please let her know she does not need adjuvant chemo, I will see her when she completes radiation, but will be happy to see her earlier if she wishes. Thanks.   Truitt Merle MD

## 2016-01-19 NOTE — Progress Notes (Signed)
  Radiation Oncology         [phone] ________________________________  Name: Laurie Calhoun MRN: [id number]  Date: 01/19/2016  DOB: 09-06-1955   DIAGNOSIS:     ICD-9-CM ICD-10-CM   1. Malignant neoplasm of upper-outer quadrant of left breast in female, estrogen receptor positive ([place]) 174.4 C50.412    V86.0 Z17.0     SIMULATION AND TREATMENT PLANNING NOTE  The patient presented for simulation prior to beginning her course of radiation treatment for her diagnosis of left-sided breast cancer. The patient was placed in a supine position on a breast board. A customized vac-lock bag was constructed and this complex treatment device will be used on a daily basis during her treatment. In this fashion, a CT scan was obtained through the chest area and an isocenter was placed near the chest wall within the breast.  The patient will be planned to receive a course of radiation initially to a dose of 42.5 Gy. This will consist of a whole breast radiotherapy technique. To accomplish this, 2 customized blocks have been designed which will correspond to medial and lateral whole breast tangent fields. This treatment will be accomplished at 2.5 Gy per fraction. A forward planning technique will also be evaluated to determine if this approach improves the plan. It is anticipated that the patient will then receive a 7.5 Gy boost to the seroma cavity which has been contoured. This will be accomplished at 2.5 Gy per fraction.   This initial treatment will consist of a 3-D conformal technique. The seroma has been contoured as the primary target structure. Additionally, dose volume histograms of both this target as well as the lungs and heart will also be evaluated. Such an approach is necessary to ensure that the target area is adequately covered while the nearby critical  normal structures are adequately spared.  Plan:  The final anticipated total dose therefore will correspond to 50 Gy.   Special  treatment procedure was performed today due to the extra time and effort required by myself to plan and prepare this patient for deep inspiration breath hold technique.  I have determined cardiac sparing to be of benefit to this patient to prevent long term cardiac damage due to radiation of the heart.  Bellows were placed on the patient's abdomen. To facilitate cardiac sparing, the patient was coached by the radiation therapists on breath hold techniques and breathing practice was performed. Practice waveforms were obtained. The patient was then scanned while maintaining breath hold in the treatment position.  This image was then transferred over to the imaging specialist. The imaging specialist then created a fusion of the free breathing and breath hold scans using the chest wall as the stable structure. I personally reviewed the fusion in axial, coronal and sagittal image planes.  Excellent cardiac sparing was obtained.  I felt the patient is an appropriate candidate for breath hold and the patient will be treated as such.  The image fusion was then reviewed with the patient to reinforce the necessity of reproducible breath hold.      _______________________________   Jodelle Gross, MD, PhD

## 2016-01-20 ENCOUNTER — Telehealth: Payer: Self-pay | Admitting: Hematology

## 2016-01-20 NOTE — Telephone Encounter (Signed)
Appointment scheduled per YF. New schedule mailed out to patient.

## 2016-01-24 ENCOUNTER — Ambulatory Visit: Payer: Managed Care, Other (non HMO) | Admitting: Physical Therapy

## 2016-01-24 ENCOUNTER — Encounter: Payer: Self-pay | Admitting: Physical Therapy

## 2016-01-24 DIAGNOSIS — M25512 Pain in left shoulder: Secondary | ICD-10-CM | POA: Diagnosis not present

## 2016-01-24 DIAGNOSIS — M25612 Stiffness of left shoulder, not elsewhere classified: Secondary | ICD-10-CM

## 2016-01-24 NOTE — Patient Instructions (Signed)
Strengthening: Resisted Internal Rotation   Hold tubing in left hand, elbow at side and forearm out. Rotate forearm in across body. Repeat __10__ times per set. Do _1___ sets per session. Do __1__ sessions per day.  http://orth.exer.us/830   Copyright  VHI. All rights reserved.  Strengthening: Resisted External Rotation   Hold tubing in left hand, elbow at side and forearm across body. Rotate forearm out. Thumb pointing out. Repeat _10___ times per set. Do __1__ sets per session. Do __1__ sessions per day.  http://orth.exer.us/828   Copyright  VHI. All rights reserved.  Strengthening: Resisted Flexion   Hold tubing with left arm at side. Pull forward and up. Move shoulder through pain-free range of motion. Repeat _10___ times per set. Do _1__ sets per session. Do _1___ sessions per day.  http://orth.exer.us/824   Copyright  VHI. All rights reserved.  Strengthening: Resisted Extension   Hold tubing in left hand, arm forward. Pull arm back, elbow straight. Repeat __10__ times per set. Do _1___ sets per session. Do _1___ sessions per day.  http://orth.exer.us/832   Copyright  VHI. All rights reserved.  Repeat on opposite side.  Over Head Pull: Narrow Grip     [place] [phone]   On back, knees bent, feet flat, band across thighs, elbows straight but relaxed. Pull hands apart (start). Keeping elbows straight, bring arms up and over head, hands toward floor. Keep pull steady on band. Hold momentarily. Return slowly, keeping pull steady, back to start. Repeat _10__ times. Band color _red_____   Side Pull: Double Arm   On back, knees bent, feet flat. Arms perpendicular to body, shoulder level, elbows straight but relaxed. Pull arms out to sides, elbows straight. Resistance band comes across collarbones, hands toward floor. Hold momentarily. Slowly return to starting position. Repeat _10__ times. Band color _red____   Elmer Picker   On back, knees bent, feet flat, left hand on  left hip, right hand above left. Pull right arm DIAGONALLY (hip to shoulder) across chest. Bring right arm along head toward floor. Hold momentarily. Slowly return to starting position. Repeat _1-__ times. Do with left arm. Band color __red___   Shoulder Rotation: Double Arm   On back, knees bent, feet flat, elbows tucked at sides, bent 90, hands palms up. Pull hands apart and down toward floor, keeping elbows near sides. Hold momentarily. Slowly return to starting position. Repeat _10__ times. Band color __red____

## 2016-01-24 NOTE — Therapy (Signed)
[place], [place], [address] Phone: [phone]   Fax:  [phone]  Physical Therapy Treatment  Patient Details  Name: Laurie Calhoun MRN: [id number] Date of Birth: 1955-05-15 Referring Provider: Dr. Rolm Bookbinder  Encounter Date: 01/24/2016      PT End of Session - 01/24/16 1436    Visit Number 2   Number of Visits 5   Date for PT Re-Evaluation 02/14/16   PT Start Time 1350   PT Stop Time 1434   PT Time Calculation (min) 44 min   Activity Tolerance Patient tolerated treatment well   Behavior During Therapy [facility] for tasks assessed/performed      Past Medical History:  Diagnosis Date  . Allergy   . Breast cancer ([place]) 12/01/2015   Left Breast  . GERD (gastroesophageal reflux disease)   . 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 History of kidney stones   . Malignant neoplasm of upper-outer quadrant of left female breast ([place]) 12/07/2015    Past Surgical History:  Procedure Laterality Date  . ABDOMINAL HYSTERECTOMY    . BREAST LUMPECTOMY WITH RADIOACTIVE SEED AND SENTINEL LYMPH NODE BIOPSY Left 12/21/2015   Procedure: LEFT BREAST LUMPECTOMY WITH RADIOACTIVE SEED AND SENTINEL LYMPH NODE BIOPSY;  Surgeon: Rolm Bookbinder, MD;  Location: [place];  Service: General;  Laterality: Left;  . BREAST SURGERY     breast lumpectomy ?2000   . COLONOSCOPY    . PILONIDAL CYST EXCISION      There were no vitals filed for this visit.      Subjective Assessment - 01/24/16 1351    Subjective Looks like I won't start radiation until next Monday. I still feel the tightness in my armpit but it is better than it was.    Pertinent History Patient was diagnosed on 11/23/15 with left grade 1 invasive ductal carcinoma breast cancer. It is located in the upper outer quadrant and is a distortion so the size is not yet determined until she has an MRI. It is ER/PR positive and HER2 negative with a ki67 of 5%.,  left lumpectomy and sentinel lymph node biopsy (2 or 3) 12/21/15   Patient Stated Goals reduce risk of lymphedema   Currently in Pain? Yes   Pain Score 2    Pain Location Axilla   Pain Orientation Left   Pain Descriptors / Indicators Tightness   Pain Type Acute pain   Pain Onset 1 to 4 weeks ago   Pain Frequency Intermittent   Aggravating Factors  lifting arm up                         [facility] Adult PT Treatment/Exercise - 01/24/16 0001      Shoulder Exercises: Supine   Horizontal ABduction Strengthening;Both;10 reps;Theraband  red   External Rotation Strengthening;Both;10 reps;Theraband  red theraband   Flexion Strengthening;Both;10 reps;Theraband  narrow and wide grip, verbal cues for slow and controlled    Other Supine Exercises D2 x 10 reps bilaterally with red theraband     Shoulder Exercises: Standing   External Rotation Strengthening;10 reps;Theraband;Both  red   Internal Rotation Strengthening;10 reps;Theraband;Both  red   Flexion Strengthening;10 reps;Theraband;Both  red   Extension Strengthening;10 reps;Theraband;Both  red     Shoulder Exercises: Therapy Ball   Flexion 10 reps  with 15 sec holds at end of stretch     Manual Therapy   Manual Therapy Myofascial release  Myofascial Release to area of tightness in left axilla                 Long Term Clinic Goals - 01/17/16 1708      CC Long Term Goal  #1   Title Patient will be able to independently verbalize lymphedema risk reduction practices    Time 4   Period Weeks   Status New     CC Long Term Goal  #2   Title Patient will report a 75% improvement in left axillary pain to allow improved comfort   Time 4   Period Weeks   Status New     CC Long Term Goal  #3   Title Patient will be independent in a home exercise program for continued stretching and strengthening so she does not loose ROM during radiation   Time 4   Period Weeks   Status New            Plan -  01/24/16 1438    Clinical Impression Statement Patient educated today in left shoulder strengthening and postural exercises. Educated patient in standing rockwood and supine scap series and issued patient handout with these exercises to complete at home daily. Performed myofascial release to area of tightness in left axilla with pt reporting increased tenderness in this area.    Rehab Potential Excellent   Clinical Impairments Affecting Rehab Potential none   PT Frequency 1x / week   PT Duration 4 weeks   PT Treatment/Interventions Therapeutic exercise;ADLs/Self Care Home Management;Patient/family education;Manual techniques;Scar mobilization;Passive range of motion   PT Next Visit Plan myofascial to left axilla, assess indep with home exercise program, continued strengthening of L shoulder, possible strength after breast cancer program   PT Home Exercise Plan supine scap series, rockwood exercises   Consulted and Agree with Plan of Care Patient      Patient will benefit from skilled therapeutic intervention in order to improve the following deficits and impairments:  Decreased knowledge of precautions, Increased fascial restricitons, Pain  Visit Diagnosis: Acute pain of left shoulder  Stiffness of left shoulder, not elsewhere classified     Problem List Patient Active Problem List   Diagnosis Date Noted  . Malignant neoplasm of upper-outer quadrant of left female breast ([place]) 12/07/2015    Allyson Sabal [facility] 01/24/2016, 2:43 PM  [place], [place], [address] Phone: [phone]   Fax:  [phone]  Name: Laurie Calhoun MRN: [id number] Date of Birth: 09/06/1955  Manus Gunning, PT 01/24/16 2:43 PM

## 2016-01-26 DIAGNOSIS — Z510 Encounter for antineoplastic radiation therapy: Secondary | ICD-10-CM | POA: Diagnosis not present

## 2016-01-28 ENCOUNTER — Ambulatory Visit
Admission: RE | Admit: 2016-01-28 | Discharge: 2016-01-28 | Disposition: A | Discharge status: Home / Self Care | Payer: Managed Care, Other (non HMO) | Source: Ambulatory Visit | Attending: Radiation Oncology | Admitting: Radiation Oncology

## 2016-01-28 DIAGNOSIS — Z510 Encounter for antineoplastic radiation therapy: Secondary | ICD-10-CM | POA: Diagnosis not present

## 2016-01-31 ENCOUNTER — Ambulatory Visit: Payer: Managed Care, Other (non HMO) | Admitting: Physical Therapy

## 2016-01-31 ENCOUNTER — Ambulatory Visit
Admission: RE | Admit: 2016-01-31 | Discharge: 2016-01-31 | Disposition: A | Discharge status: Home / Self Care | Payer: Managed Care, Other (non HMO) | Source: Ambulatory Visit | Attending: Radiation Oncology | Admitting: Radiation Oncology

## 2016-01-31 ENCOUNTER — Encounter: Payer: Self-pay | Admitting: Physical Therapy

## 2016-01-31 DIAGNOSIS — Z510 Encounter for antineoplastic radiation therapy: Secondary | ICD-10-CM | POA: Diagnosis not present

## 2016-01-31 DIAGNOSIS — M25512 Pain in left shoulder: Secondary | ICD-10-CM | POA: Diagnosis not present

## 2016-01-31 DIAGNOSIS — M25612 Stiffness of left shoulder, not elsewhere classified: Secondary | ICD-10-CM

## 2016-01-31 NOTE — Therapy (Signed)
[place], [place], [address] Phone: [phone]   Fax:  [phone]  Physical Therapy Treatment  Patient Details  Name: Laurie Calhoun MRN: [id number] Date of Birth: Aug 05, 1955 Referring Provider: Dr. Rolm Bookbinder  Encounter Date: 01/31/2016      PT End of Session - 01/31/16 1350    Visit Number 3   Number of Visits 5   Date for PT Re-Evaluation 02/14/16   PT Start Time 6812   PT Stop Time 7517   PT Time Calculation (min) 41 min   Activity Tolerance Patient tolerated treatment well   Behavior During Therapy [facility] for tasks assessed/performed      Past Medical History:  Diagnosis Date  . Allergy   . Breast cancer ([place]) 12/01/2015   Left Breast  . GERD (gastroesophageal reflux disease)   . 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 History of kidney stones   . Malignant neoplasm of upper-outer quadrant of left female breast ([place]) 12/07/2015    Past Surgical History:  Procedure Laterality Date  . ABDOMINAL HYSTERECTOMY    . BREAST LUMPECTOMY WITH RADIOACTIVE SEED AND SENTINEL LYMPH NODE BIOPSY Left 12/21/2015   Procedure: LEFT BREAST LUMPECTOMY WITH RADIOACTIVE SEED AND SENTINEL LYMPH NODE BIOPSY;  Surgeon: Rolm Bookbinder, MD;  Location: [facility];  Service: General;  Laterality: Left;  . BREAST SURGERY     breast lumpectomy ?2000   . COLONOSCOPY    . PILONIDAL CYST EXCISION      There were no vitals filed for this visit.      Subjective Assessment - 01/31/16 1309    Subjective I started radiation today. I have been doing the exercises some with the band on the door. I was away yesterday so I didn't do them.    Pertinent History Patient was diagnosed on 11/23/15 with left grade 1 invasive ductal carcinoma breast cancer. It is located in the upper outer quadrant and is a distortion so the size is not yet determined until she has an MRI. It is ER/PR positive and HER2 negative with a ki67  of 5%., left lumpectomy and sentinel lymph node biopsy (2 or 3) 12/21/15   Patient Stated Goals reduce risk of lymphedema   Currently in Pain? No/denies   Pain Score 0-No pain                         [facility] Adult PT Treatment/Exercise - 01/31/16 0001      Shoulder Exercises: Standing   Other Standing Exercises Instructed pt in entire ABC program, stretches held for 15 sec, all exercises x 10 reps with 2lb weights     Manual Therapy   Manual Therapy Myofascial release   Myofascial Release to area of tightness in left axilla                 Long Term Clinic Goals - 01/31/16 1349      CC Long Term Goal  #1   Title Patient will be able to independently verbalize lymphedema risk reduction practices    Time 4   Period Weeks   Status On-going     CC Long Term Goal  #2   Title Patient will report a 75% improvement in left axillary pain to allow improved comfort   Time 4   Period Weeks   Status On-going     CC Long Term Goal  #3   Title Patient  will be independent in a home exercise program for continued stretching and strengthening so she does not loose ROM during radiation   Time 4   Period Weeks   Status On-going            Plan - 01/31/16 1350    Clinical Impression Statement Instructed pt in strength after breast cancer program today for overall strengthening and to improve posture. Patient has been performing rockwood exercises at home. Strength ABC was issued to pt as part of her home exercise program. Ended session with myofascial to area of tightness in left axilla. Pt reports decreased tenderness in this area with LUE movement. Gave pt information to obtain compression sleeve when she travels by plane.    Rehab Potential Excellent   Clinical Impairments Affecting Rehab Potential none   PT Frequency 1x / week   PT Duration 4 weeks   PT Treatment/Interventions Therapeutic exercise;ADLs/Self Care Home Management;Patient/family education;Manual  techniques;Scar mobilization;Passive range of motion   PT Next Visit Plan myofascial to left axilla, assess indep with home exercise program including strength ABC program, follow up to see if pt went to [place] for compression sleeve   PT Home Exercise Plan supine scap series, rockwood exercises, strength ABC program   Consulted and Agree with Plan of Care Patient      Patient will benefit from skilled therapeutic intervention in order to improve the following deficits and impairments:  Decreased knowledge of precautions, Increased fascial restricitons, Pain  Visit Diagnosis: Acute pain of left shoulder  Stiffness of left shoulder, not elsewhere classified     Problem List Patient Active Problem List   Diagnosis Date Noted  . Malignant neoplasm of upper-outer quadrant of left female breast ([place]) 12/07/2015    Allyson Sabal [facility] 01/31/2016, 1:53 PM  [place], [place], [address] Phone: [phone]   Fax:  [phone]  Name: Aleida Crandell MRN: [id number] Date of Birth: Aug 03, 1955  Manus Gunning, PT 01/31/16 1:53 PM

## 2016-02-01 ENCOUNTER — Ambulatory Visit
Admission: RE | Admit: 2016-02-01 | Discharge: 2016-02-01 | Disposition: A | Discharge status: Home / Self Care | Payer: Managed Care, Other (non HMO) | Source: Ambulatory Visit | Attending: Radiation Oncology | Admitting: Radiation Oncology

## 2016-02-01 ENCOUNTER — Telehealth: Payer: Self-pay | Admitting: *Deleted

## 2016-02-01 DIAGNOSIS — Z170 Estrogen receptor positive status [ER+]: Secondary | ICD-10-CM | POA: Diagnosis present

## 2016-02-01 DIAGNOSIS — C50412 Malignant neoplasm of upper-outer quadrant of left female breast: Secondary | ICD-10-CM | POA: Diagnosis not present

## 2016-02-01 DIAGNOSIS — Z510 Encounter for antineoplastic radiation therapy: Secondary | ICD-10-CM | POA: Diagnosis not present

## 2016-02-01 MED ORDER — ALRA NON-METALLIC DEODORANT (RAD-ONC)
1.0000 "application " | Freq: Once | TOPICAL | Status: AC
  Administered 2016-02-01: 1 via TOPICAL

## 2016-02-01 MED ORDER — RADIAPLEXRX EX GEL
Freq: Once | CUTANEOUS | Status: AC
  Administered 2016-02-01: 13:00:00 via TOPICAL

## 2016-02-01 NOTE — Addendum Note (Signed)
Encounter addended by: Doreen Beam, RN on: 02/01/2016  1:06 PM<BR>    Actions taken: Chief Complaint modified, Vitals modified, Sign clinical note, Patient Education assessment filed, Flowsheet accepted

## 2016-02-01 NOTE — Telephone Encounter (Signed)
Called patient, infomred her that I spoke with Bryson Ha and she wrote her the rx for lymphedema sleeve x2, she can pick up in envelope at therapist  Area tomorrow, Anderson Malta RT therapist is aware, patient thanked this RN and Bryson Ha 1:06 PM -

## 2016-02-01 NOTE — Progress Notes (Signed)
Pt education done, Breast, my business card, radiation thrapy and you book, alra deodorant,radiaplex gel given, discussed ways to manage side effects, skin irritation, pain, fatigue, use radiaplex bid after treatment and  bedtime , luke warm showers, dove soap unscented preferred, pat dry, no rubbing scrubbing or scratching, electric razor only for shaving under arms, no heat pads or ice pack to treated area, no under wire bras if possible,  Increase protein in diet, drink plenty water,stay hydrated, teach back given

## 2016-02-02 ENCOUNTER — Ambulatory Visit
Admission: RE | Admit: 2016-02-02 | Discharge: 2016-02-02 | Disposition: A | Discharge status: Home / Self Care | Payer: Managed Care, Other (non HMO) | Source: Ambulatory Visit | Attending: Radiation Oncology | Admitting: Radiation Oncology

## 2016-02-02 DIAGNOSIS — Z510 Encounter for antineoplastic radiation therapy: Secondary | ICD-10-CM | POA: Diagnosis not present

## 2016-02-03 ENCOUNTER — Ambulatory Visit
Admission: RE | Admit: 2016-02-03 | Discharge: 2016-02-03 | Disposition: A | Discharge status: Home / Self Care | Payer: Managed Care, Other (non HMO) | Source: Ambulatory Visit | Attending: Radiation Oncology | Admitting: Radiation Oncology

## 2016-02-03 DIAGNOSIS — Z510 Encounter for antineoplastic radiation therapy: Secondary | ICD-10-CM | POA: Diagnosis not present

## 2016-02-04 ENCOUNTER — Ambulatory Visit
Admission: RE | Admit: 2016-02-04 | Discharge: 2016-02-04 | Disposition: A | Discharge status: Home / Self Care | Payer: Managed Care, Other (non HMO) | Source: Ambulatory Visit | Attending: Radiation Oncology | Admitting: Radiation Oncology

## 2016-02-04 ENCOUNTER — Encounter: Payer: Self-pay | Admitting: Radiation Oncology

## 2016-02-04 VITALS — BP 123/79 | HR 79 | Temp 98.0°F | Resp 16 | Wt 182.2 lb

## 2016-02-04 DIAGNOSIS — Z510 Encounter for antineoplastic radiation therapy: Secondary | ICD-10-CM | POA: Diagnosis not present

## 2016-02-04 DIAGNOSIS — Z170 Estrogen receptor positive status [ER+]: Principal | ICD-10-CM

## 2016-02-04 DIAGNOSIS — C50412 Malignant neoplasm of upper-outer quadrant of left female breast: Secondary | ICD-10-CM

## 2016-02-04 NOTE — Progress Notes (Signed)
Weekly rad txs left breast ,mild erythema, 5/20  Completed, skin intact,   Using radiaplex bid,  Appetite good,  Drinking plenty water, and  greenish phelgm,  For 4-5 days 9:30 AM BP 123/79 (BP Location: Left Arm, Patient Position: Sitting, Cuff Size: Normal)   Pulse 79   Temp 98 F (36.7 C) (Oral)   Resp 16   Wt 182 lb 3.2 oz (82.6 kg)   BMI 29.41 kg/m   Wt Readings from Last 3 Encounters:  02/04/16 182 lb 3.2 oz (82.6 kg)  01/19/16 184 lb 9.6 oz (83.7 kg)  12/15/15 181 lb 11.2 oz (82.4 kg)

## 2016-02-04 NOTE — Progress Notes (Signed)
   Department of Radiation Oncology  Phone:  [phone] Fax:        [phone]  Weekly Treatment Note    Name: Laurie Calhoun Date: 02/04/2016 MRN: [id number] DOB: 02-Feb-1955   Diagnosis:     ICD-9-CM ICD-10-CM   1. Malignant neoplasm of upper-outer quadrant of left breast in female, estrogen receptor positive ([place]) 174.4 C50.412    V86.0 Z17.0      Current dose: 12.5 Gy  Current fraction: 5   MEDICATIONS: Current Outpatient Prescriptions  Medication Sig Dispense Refill  . diphenhydramine-acetaminophen (TYLENOL PM) 25-500 MG TABS tablet Take 2 tablets by mouth at bedtime as needed (for sleep.).    Marland Kitchen hyaluronate sodium (RADIAPLEXRX) GEL Apply 1 application topically 2 (two) times daily.    . Multiple Vitamins-Minerals (CENTRUM ADULTS PO) Take by mouth.    . non-metallic deodorant Jethro Poling) MISC Apply 1 application topically daily as needed.    . pseudoephedrine-guaifenesin (MUCINEX D) 60-600 MG 12 hr tablet Take 1 tablet by mouth every 12 (twelve) hours.    . Vitamin D, Ergocalciferol, (DRISDOL) 50000 units CAPS capsule Take 50,000 Units by mouth every Monday.      No current facility-administered medications for this encounter.      ALLERGIES: Penicillins   LABORATORY DATA:  Lab Results  Component Value Date   WBC 4.9 12/15/2015   HGB 13.4 12/15/2015   HCT 40.4 12/15/2015   MCV 92.1 12/15/2015   PLT 302 12/15/2015   Lab Results  Component Value Date   NA 141 12/15/2015   K 4.0 12/15/2015   CL 110 12/09/2014   CO2 27 12/15/2015   Lab Results  Component Value Date   ALT 14 12/15/2015   AST 11 12/15/2015   ALKPHOS 70 12/15/2015   BILITOT 0.67 12/15/2015     NARRATIVE: Laurie Calhoun was seen today for weekly treatment management. The chart was checked and the patient's films were reviewed.  The patient states that she is doing very well this week. She has not had any problems. No significant skin irritation currently.  PHYSICAL EXAMINATION:  weight is 182 lb 3.2 oz (82.6 kg). Her oral temperature is 98 F (36.7 C). Her blood pressure is 123/79 and her pulse is 79. Her respiration is 16.        ASSESSMENT: The patient is doing satisfactorily with treatment.  PLAN: We will continue with the patient's radiation treatment as planned.

## 2016-02-07 ENCOUNTER — Ambulatory Visit
Admission: RE | Admit: 2016-02-07 | Discharge: 2016-02-07 | Disposition: A | Discharge status: Home / Self Care | Payer: Managed Care, Other (non HMO) | Source: Ambulatory Visit | Attending: Radiation Oncology | Admitting: Radiation Oncology

## 2016-02-07 DIAGNOSIS — Z510 Encounter for antineoplastic radiation therapy: Secondary | ICD-10-CM | POA: Diagnosis not present

## 2016-02-08 ENCOUNTER — Ambulatory Visit
Admission: RE | Admit: 2016-02-08 | Discharge: 2016-02-08 | Disposition: A | Discharge status: Home / Self Care | Payer: Managed Care, Other (non HMO) | Source: Ambulatory Visit | Attending: Radiation Oncology | Admitting: Radiation Oncology

## 2016-02-08 ENCOUNTER — Ambulatory Visit: Payer: Managed Care, Other (non HMO) | Admitting: Physical Therapy

## 2016-02-08 ENCOUNTER — Encounter: Payer: Self-pay | Admitting: Physical Therapy

## 2016-02-08 DIAGNOSIS — Z510 Encounter for antineoplastic radiation therapy: Secondary | ICD-10-CM | POA: Diagnosis not present

## 2016-02-08 DIAGNOSIS — M25512 Pain in left shoulder: Secondary | ICD-10-CM | POA: Diagnosis not present

## 2016-02-08 DIAGNOSIS — M25612 Stiffness of left shoulder, not elsewhere classified: Secondary | ICD-10-CM

## 2016-02-08 NOTE — Therapy (Addendum)
[place], [place], [address] Phone: [phone]   Fax:  [phone]  Physical Therapy Treatment  Patient Details  Name: Laurie Calhoun MRN: [id number] Date of Birth: [birth year] Referring Provider: Dr. Rolm Bookbinder  Encounter Date: 02/08/2016      PT End of Session - 02/08/16 1059    Visit Number 4   Number of Visits 5   Date for PT Re-Evaluation 02/14/16   PT Start Time 1016   PT Stop Time 1056   PT Time Calculation (min) 40 min   Activity Tolerance Patient tolerated treatment well   Behavior During Therapy [facility] for tasks assessed/performed      Past Medical History:  Diagnosis Date  . Allergy   . Breast cancer ([place]) 12/01/2015   Left Breast  . GERD (gastroesophageal reflux disease)   . 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 History of kidney stones   . Malignant neoplasm of upper-outer quadrant of left female breast ([place]) 12/07/2015    Past Surgical History:  Procedure Laterality Date  . ABDOMINAL HYSTERECTOMY    . BREAST LUMPECTOMY WITH RADIOACTIVE SEED AND SENTINEL LYMPH NODE BIOPSY Left 12/21/2015   Procedure: LEFT BREAST LUMPECTOMY WITH RADIOACTIVE SEED AND SENTINEL LYMPH NODE BIOPSY;  Surgeon: Rolm Bookbinder, MD;  Location: [place];  Service: General;  Laterality: Left;  . BREAST SURGERY     breast lumpectomy ?2000   . COLONOSCOPY    . PILONIDAL CYST EXCISION      There were no vitals filed for this visit.      Subjective Assessment - 02/08/16 1019    Subjective I have been sick. Sunday I spent all day in bed. Yesterday I got off work and went straight to bed. I have been on antibiotics since Saturday.    Pertinent History Patient was diagnosed on 11/23/15 with left grade 1 invasive ductal carcinoma breast cancer. It is located in the upper outer quadrant and is a distortion so the size is not yet determined until she has an MRI. It is ER/PR positive and HER2 negative  with a ki67 of 5%., left lumpectomy and sentinel lymph node biopsy (2 or 3) 12/21/15   Patient Stated Goals reduce risk of lymphedema   Currently in Pain? No/denies   Pain Score 0-No pain                         [facility] Adult PT Treatment/Exercise - 02/08/16 0001      Shoulder Exercises: Standing   Other Standing Exercises Instructed pt in entire ABC program, stretches held for 15 sec, all exercises x 10 reps with 2lb weights                   Long Term Clinic Goals - 02/08/16 1023      CC Long Term Goal  #1   Title Patient will be able to independently verbalize lymphedema risk reduction practices    Baseline 02/08/16- pt was able to independently verbalize this   Time 4   Period Weeks   Status Achieved     CC Long Term Goal  #2   Title Patient will report a 75% improvement in left axillary pain to allow improved comfort   Baseline 02/08/16- 100% improvement   Time 4   Period Weeks   Status Achieved     CC Long Term Goal  #3   Title Patient will be  independent in a home exercise program for continued stretching and strengthening so she does not loose ROM during radiation   Time 4   Period Weeks   Status On-going            Plan - 02/08/16 1059    Clinical Impression Statement Assessed pt's progress towards goals today in therapy. She has been sick since last week and was not up to practicing her home exercise program over the weekend. Reivewed pt's home exercise program with her today. Will assess pt's independence with her home exercise program next week and if pt is independent she will be ready for discharge from skilled PT services because she has met all of her other goals.    Rehab Potential Excellent   Clinical Impairments Affecting Rehab Potential none   PT Frequency 1x / week   PT Duration 4 weeks   PT Treatment/Interventions Therapeutic exercise;ADLs/Self Care Home Management;Patient/family education;Manual techniques;Scar  mobilization;Passive range of motion   PT Next Visit Plan myofascial to left axilla, assess indep with home exercise program including strength ABC program, follow up to see if pt went to [facility] for compression sleeve   PT Home Exercise Plan supine scap series, rockwood exercises, strength ABC program   Consulted and Agree with Plan of Care Patient      Patient will benefit from skilled therapeutic intervention in order to improve the following deficits and impairments:  Decreased knowledge of precautions, Increased fascial restricitons, Pain  Visit Diagnosis: Acute pain of left shoulder  Stiffness of left shoulder, not elsewhere classified     Problem List Patient Active Problem List   Diagnosis Date Noted  . Malignant neoplasm of upper-outer quadrant of left female breast ([place]) 12/07/2015    Allyson Sabal [facility] 02/08/2016, 11:02 AM  [place], [place], [address] Phone: [phone]   Fax:  [phone]  Name: Laurie Calhoun MRN: [id number] Date of Birth: 1955-11-30  Manus Gunning, PT 02/08/16 11:03 AM  PHYSICAL THERAPY DISCHARGE SUMMARY  Visits from Start of Care: 4  Current functional level related to goals / functional outcomes: See above   Remaining deficits: See above   Education / Equipment: See above  Plan: Patient agrees to discharge.  Patient goals were partially met. Patient is being discharged due to not returning since the last visit.  ?????    Allyson Sabal [place], [place] 02/02/17 11:50 AM

## 2016-02-09 ENCOUNTER — Ambulatory Visit
Admission: RE | Admit: 2016-02-09 | Discharge: 2016-02-09 | Disposition: A | Discharge status: Home / Self Care | Payer: Managed Care, Other (non HMO) | Source: Ambulatory Visit | Attending: Radiation Oncology | Admitting: Radiation Oncology

## 2016-02-09 DIAGNOSIS — Z510 Encounter for antineoplastic radiation therapy: Secondary | ICD-10-CM | POA: Diagnosis not present

## 2016-02-10 ENCOUNTER — Ambulatory Visit
Admission: RE | Admit: 2016-02-10 | Discharge: 2016-02-10 | Disposition: A | Discharge status: Home / Self Care | Payer: Managed Care, Other (non HMO) | Source: Ambulatory Visit | Attending: Radiation Oncology | Admitting: Radiation Oncology

## 2016-02-10 DIAGNOSIS — Z510 Encounter for antineoplastic radiation therapy: Secondary | ICD-10-CM | POA: Diagnosis not present

## 2016-02-11 ENCOUNTER — Encounter: Payer: Managed Care, Other (non HMO) | Admitting: Physical Therapy

## 2016-02-11 ENCOUNTER — Ambulatory Visit
Admission: RE | Admit: 2016-02-11 | Discharge: 2016-02-11 | Disposition: A | Discharge status: Home / Self Care | Payer: Managed Care, Other (non HMO) | Source: Ambulatory Visit | Attending: Radiation Oncology | Admitting: Radiation Oncology

## 2016-02-11 DIAGNOSIS — Z170 Estrogen receptor positive status [ER+]: Principal | ICD-10-CM

## 2016-02-11 DIAGNOSIS — Z510 Encounter for antineoplastic radiation therapy: Secondary | ICD-10-CM | POA: Diagnosis not present

## 2016-02-11 DIAGNOSIS — C50412 Malignant neoplasm of upper-outer quadrant of left female breast: Secondary | ICD-10-CM

## 2016-02-11 NOTE — Progress Notes (Signed)
   Department of Radiation Oncology  Phone:  [phone] Fax:        [phone]  Weekly Treatment Note    Name: Laurie Calhoun Date: 02/11/2016 MRN: [id number] DOB: Dec 06, 1955   Diagnosis:     ICD-9-CM ICD-10-CM   1. Malignant neoplasm of upper-outer quadrant of left breast in female, estrogen receptor positive ([place]) 174.4 C50.412    V86.0 Z17.0      Current dose: 25 Gy  Current fraction: 10   MEDICATIONS: Current Outpatient Prescriptions  Medication Sig Dispense Refill  . diphenhydramine-acetaminophen (TYLENOL PM) 25-500 MG TABS tablet Take 2 tablets by mouth at bedtime as needed (for sleep.).    Marland Kitchen hyaluronate sodium (RADIAPLEXRX) GEL Apply 1 application topically 2 (two) times daily.    . Multiple Vitamins-Minerals (CENTRUM ADULTS PO) Take by mouth.    . non-metallic deodorant Jethro Poling) MISC Apply 1 application topically daily as needed.    . pseudoephedrine-guaifenesin (MUCINEX D) 60-600 MG 12 hr tablet Take 1 tablet by mouth every 12 (twelve) hours.    . Vitamin D, Ergocalciferol, (DRISDOL) 50000 units CAPS capsule Take 50,000 Units by mouth every Monday.      No current facility-administered medications for this encounter.      ALLERGIES: Penicillins   LABORATORY DATA:  Lab Results  Component Value Date   WBC 4.9 12/15/2015   HGB 13.4 12/15/2015   HCT 40.4 12/15/2015   MCV 92.1 12/15/2015   PLT 302 12/15/2015   Lab Results  Component Value Date   NA 141 12/15/2015   K 4.0 12/15/2015   CL 110 12/09/2014   CO2 27 12/15/2015   Lab Results  Component Value Date   ALT 14 12/15/2015   AST 11 12/15/2015   ALKPHOS 70 12/15/2015   BILITOT 0.67 12/15/2015     NARRATIVE: Laurie Calhoun was seen today for weekly treatment management. The chart was checked and the patient's films were reviewed.  The patient states that she is doing very well this week. No new issues. Minor pruritus but this has been tolerable.  PHYSICAL EXAMINATION: vitals were  not taken for this visit.     Alert, no acute distress. In good spirits.  ASSESSMENT: The patient is doing satisfactorily with treatment.  PLAN: We will continue with the patient's radiation treatment as planned.

## 2016-02-11 NOTE — Progress Notes (Signed)
Pt seen  By MD  On linac table, not sent to nursing for assessment

## 2016-02-14 ENCOUNTER — Encounter: Payer: Self-pay | Admitting: Radiation Oncology

## 2016-02-14 ENCOUNTER — Ambulatory Visit
Admission: RE | Admit: 2016-02-14 | Discharge: 2016-02-14 | Disposition: A | Discharge status: Home / Self Care | Payer: Managed Care, Other (non HMO) | Source: Ambulatory Visit | Attending: Radiation Oncology | Admitting: Radiation Oncology

## 2016-02-14 ENCOUNTER — Ambulatory Visit: Payer: Managed Care, Other (non HMO) | Admitting: Physical Therapy

## 2016-02-14 DIAGNOSIS — Z510 Encounter for antineoplastic radiation therapy: Secondary | ICD-10-CM | POA: Diagnosis not present

## 2016-02-15 ENCOUNTER — Ambulatory Visit
Admission: RE | Admit: 2016-02-15 | Discharge: 2016-02-15 | Disposition: A | Discharge status: Home / Self Care | Payer: Managed Care, Other (non HMO) | Source: Ambulatory Visit | Attending: Radiation Oncology | Admitting: Radiation Oncology

## 2016-02-15 DIAGNOSIS — Z510 Encounter for antineoplastic radiation therapy: Secondary | ICD-10-CM | POA: Diagnosis not present

## 2016-02-16 ENCOUNTER — Ambulatory Visit
Admission: RE | Admit: 2016-02-16 | Discharge: 2016-02-16 | Disposition: A | Discharge status: Home / Self Care | Payer: Managed Care, Other (non HMO) | Source: Ambulatory Visit | Attending: Radiation Oncology | Admitting: Radiation Oncology

## 2016-02-16 DIAGNOSIS — Z510 Encounter for antineoplastic radiation therapy: Secondary | ICD-10-CM | POA: Diagnosis not present

## 2016-02-17 ENCOUNTER — Ambulatory Visit
Admission: RE | Admit: 2016-02-17 | Discharge: 2016-02-17 | Disposition: A | Discharge status: Home / Self Care | Payer: Managed Care, Other (non HMO) | Source: Ambulatory Visit | Attending: Radiation Oncology | Admitting: Radiation Oncology

## 2016-02-17 DIAGNOSIS — Z510 Encounter for antineoplastic radiation therapy: Secondary | ICD-10-CM | POA: Diagnosis not present

## 2016-02-18 ENCOUNTER — Ambulatory Visit
Admission: RE | Admit: 2016-02-18 | Discharge: 2016-02-18 | Disposition: A | Discharge status: Home / Self Care | Payer: Managed Care, Other (non HMO) | Source: Ambulatory Visit | Attending: Radiation Oncology | Admitting: Radiation Oncology

## 2016-02-18 ENCOUNTER — Encounter: Payer: Self-pay | Admitting: Radiation Oncology

## 2016-02-18 ENCOUNTER — Ambulatory Visit
Admission: RE | Admit: 2016-02-18 | Payer: Managed Care, Other (non HMO) | Source: Ambulatory Visit | Admitting: Radiation Oncology

## 2016-02-18 VITALS — BP 130/75 | HR 79 | Temp 97.8°F | Resp 16 | Wt 183.4 lb

## 2016-02-18 DIAGNOSIS — Z170 Estrogen receptor positive status [ER+]: Secondary | ICD-10-CM | POA: Diagnosis present

## 2016-02-18 DIAGNOSIS — Z880 Allergy status to penicillin: Secondary | ICD-10-CM | POA: Diagnosis not present

## 2016-02-18 DIAGNOSIS — C50412 Malignant neoplasm of upper-outer quadrant of left female breast: Secondary | ICD-10-CM | POA: Diagnosis not present

## 2016-02-18 DIAGNOSIS — Z79899 Other long term (current) drug therapy: Secondary | ICD-10-CM | POA: Diagnosis not present

## 2016-02-18 DIAGNOSIS — Z510 Encounter for antineoplastic radiation therapy: Secondary | ICD-10-CM | POA: Diagnosis not present

## 2016-02-18 MED ORDER — RADIAPLEXRX EX GEL
Freq: Once | CUTANEOUS | Status: AC
  Administered 2016-02-18: 10:00:00 via TOPICAL

## 2016-02-18 NOTE — Progress Notes (Signed)
Complex simulation note  Diagnosis: left-sided breast cancer  Narrative The patient has initially been planned to receive a course of whole breast radiation to a dose of 42.5 Gy in 17 fractions. The patient will now receive an additional boost to the seroma cavity which has been contoured. This will correspond to a boost of 7.5 Gy at 2.5 Gy per fraction. To accomplish this, an additional 3 customized blocks have been designed for this purpose. A complex isodose plan is requested to ensure that the target area is adequately covered with radiation dose and that the nearby normal structures such as the lung are adequately spared. The patient's final total dose will be 50 Gy.  ------------------------------------------------  Lucyle Alumbaugh S. Phyllicia Dudek, MD, PhD 

## 2016-02-18 NOTE — Progress Notes (Signed)
Weekly rad txs  Left breast  15/20 completed, erythema, dryness at nipple and sore ness there, skin intact,  Using radiaplex bid,  Gave another radiaplex gel  Given, 9:31 AM BP 130/75 (BP Location: Right Arm, Patient Position: Sitting, Cuff Size: Normal)   Pulse 79   Temp 97.8 F (36.6 C) (Oral)   Resp 16   Wt 183 lb 6.4 oz (83.2 kg)   BMI 29.60 kg/m   Wt Readings from Last 3 Encounters:  02/18/16 183 lb 6.4 oz (83.2 kg)  02/04/16 182 lb 3.2 oz (82.6 kg)  01/19/16 184 lb 9.6 oz (83.7 kg)

## 2016-02-18 NOTE — Progress Notes (Signed)
   Department of Radiation Oncology  Phone:  [phone] Fax:        [phone]  Weekly Treatment Note    Name: Laurie Calhoun Date: 02/18/2016 MRN: [id number] DOB: [birth year]   Diagnosis:     ICD-9-CM ICD-10-CM   1. Malignant neoplasm of upper-outer quadrant of left breast in female, estrogen receptor positive ([place]) 174.4 C50.412 hyaluronate sodium (RADIAPLEXRX) gel   V86.0 Z17.0      Current dose: 37.5 Gy  Current fraction: 15   MEDICATIONS: Current Outpatient Prescriptions  Medication Sig Dispense Refill  . diphenhydramine-acetaminophen (TYLENOL PM) 25-500 MG TABS tablet Take 2 tablets by mouth at bedtime as needed (for sleep.).    Marland Kitchen hyaluronate sodium (RADIAPLEXRX) GEL Apply 1 application topically 2 (two) times daily.    . Multiple Vitamins-Minerals (CENTRUM ADULTS PO) Take by mouth.    . non-metallic deodorant Jethro Poling) MISC Apply 1 application topically daily as needed.    . pseudoephedrine-guaifenesin (MUCINEX D) 60-600 MG 12 hr tablet Take 1 tablet by mouth every 12 (twelve) hours.    . Vitamin D, Ergocalciferol, (DRISDOL) 50000 units CAPS capsule Take 50,000 Units by mouth every Monday.      No current facility-administered medications for this encounter.      ALLERGIES: Penicillins   LABORATORY DATA:  Lab Results  Component Value Date   WBC 4.9 12/15/2015   HGB 13.4 12/15/2015   HCT 40.4 12/15/2015   MCV 92.1 12/15/2015   PLT 302 12/15/2015   Lab Results  Component Value Date   NA 141 12/15/2015   K 4.0 12/15/2015   CL 110 12/09/2014   CO2 27 12/15/2015   Lab Results  Component Value Date   ALT 14 12/15/2015   AST 11 12/15/2015   ALKPHOS 70 12/15/2015   BILITOT 0.67 12/15/2015     NARRATIVE: [name] was seen today for weekly treatment management. The chart was checked and the patient's films were reviewed.  The patient is doing very well. She has one more week remaining. Some irritation has increased with sensitivity of the  skin. She feels this is manageable.  Weekly rad txs  Left breast  15/20 completed, erythema, dryness at nipple and sore ness there, skin intact,  Using radiaplex bid,  Gave another radiaplex gel  Given, 9:57 AM BP 130/75 (BP Location: Right Arm, Patient Position: Sitting, Cuff Size: Normal)   Pulse 79   Temp 97.8 F (36.6 C) (Oral)   Resp 16   Wt 183 lb 6.4 oz (83.2 kg)   BMI 29.60 kg/m   Wt Readings from Last 3 Encounters:  02/18/16 183 lb 6.4 oz (83.2 kg)  02/04/16 182 lb 3.2 oz (82.6 kg)  01/19/16 184 lb 9.6 oz (83.7 kg)    PHYSICAL EXAMINATION: weight is 183 lb 6.4 oz (83.2 kg). Her oral temperature is 97.8 F (36.6 C). Her blood pressure is 130/75 and her pulse is 79. Her respiration is 16.      Erythema diffusely in the treatment area, moderate. No significant skin desquamation. Overall the patient's skin looks good for where she is in her course of treatment.  ASSESSMENT: The patient is doing satisfactorily with treatment.  PLAN: We will continue with the patient's radiation treatment as planned.

## 2016-02-21 ENCOUNTER — Ambulatory Visit
Admission: RE | Admit: 2016-02-21 | Discharge: 2016-02-21 | Disposition: A | Discharge status: Home / Self Care | Payer: Managed Care, Other (non HMO) | Source: Ambulatory Visit | Attending: Radiation Oncology | Admitting: Radiation Oncology

## 2016-02-21 DIAGNOSIS — Z510 Encounter for antineoplastic radiation therapy: Secondary | ICD-10-CM | POA: Diagnosis not present

## 2016-02-22 ENCOUNTER — Ambulatory Visit
Admission: RE | Admit: 2016-02-22 | Discharge: 2016-02-22 | Disposition: A | Discharge status: Home / Self Care | Payer: Managed Care, Other (non HMO) | Source: Ambulatory Visit | Attending: Radiation Oncology | Admitting: Radiation Oncology

## 2016-02-22 DIAGNOSIS — Z510 Encounter for antineoplastic radiation therapy: Secondary | ICD-10-CM | POA: Diagnosis not present

## 2016-02-23 ENCOUNTER — Ambulatory Visit
Admission: RE | Admit: 2016-02-23 | Discharge: 2016-02-23 | Disposition: A | Discharge status: Home / Self Care | Payer: Managed Care, Other (non HMO) | Source: Ambulatory Visit | Attending: Radiation Oncology | Admitting: Radiation Oncology

## 2016-02-23 DIAGNOSIS — Z510 Encounter for antineoplastic radiation therapy: Secondary | ICD-10-CM | POA: Diagnosis not present

## 2016-02-24 ENCOUNTER — Ambulatory Visit
Admission: RE | Admit: 2016-02-24 | Discharge: 2016-02-24 | Disposition: A | Discharge status: Home / Self Care | Payer: Managed Care, Other (non HMO) | Source: Ambulatory Visit | Attending: Radiation Oncology | Admitting: Radiation Oncology

## 2016-02-24 DIAGNOSIS — Z510 Encounter for antineoplastic radiation therapy: Secondary | ICD-10-CM | POA: Diagnosis not present

## 2016-02-25 ENCOUNTER — Encounter: Payer: Self-pay | Admitting: Radiation Oncology

## 2016-02-25 ENCOUNTER — Telehealth: Payer: Self-pay | Admitting: *Deleted

## 2016-02-25 ENCOUNTER — Ambulatory Visit
Admission: RE | Admit: 2016-02-25 | Discharge: 2016-02-25 | Disposition: A | Discharge status: Home / Self Care | Payer: Managed Care, Other (non HMO) | Source: Ambulatory Visit | Attending: Radiation Oncology | Admitting: Radiation Oncology

## 2016-02-25 VITALS — BP 110/60 | HR 80 | Temp 98.3°F | Resp 16 | Wt 180.8 lb

## 2016-02-25 DIAGNOSIS — Z170 Estrogen receptor positive status [ER+]: Principal | ICD-10-CM

## 2016-02-25 DIAGNOSIS — C50412 Malignant neoplasm of upper-outer quadrant of left female breast: Secondary | ICD-10-CM

## 2016-02-25 DIAGNOSIS — Z510 Encounter for antineoplastic radiation therapy: Secondary | ICD-10-CM | POA: Diagnosis not present

## 2016-02-25 NOTE — Telephone Encounter (Signed)
  Oncology Nurse Navigator Documentation  Navigator Location: [facility] (02/25/16 1500)   )Navigator Encounter Type: Telephone (02/25/16 1500) Telephone: [facility] Call (02/25/16 1500)                   Patient Visit Type: RadOnc (02/25/16 1500) Treatment Phase: Final Radiation Tx (02/25/16 1500)     Interventions: Referrals (02/25/16 1500) Referrals: Survivorship (02/25/16 1500)                    Time Spent with Patient: 15 (02/25/16 1500)

## 2016-02-25 NOTE — Progress Notes (Signed)
Weekly rad txs  lrft breast 20/20 completed, erythema,  Skin intact,  Has some bumps on breast, uses radiaplex bid,  Gave FYYN flyer  , appetite good,,  Will start lotion with vitamin e in a couple weeks 9:19 AM BP 110/60 (BP Location: Right Arm, Patient Position: Sitting, Cuff Size: Normal)   Pulse 80   Temp 98.3 F (36.8 C) (Oral)   Resp 16   Wt 180 lb 12.8 oz (82 kg)   BMI 29.18 kg/m   Wt Readings from Last 3 Encounters:  02/25/16 180 lb 12.8 oz (82 kg)  02/18/16 183 lb 6.4 oz (83.2 kg)  02/04/16 182 lb 3.2 oz (82.6 kg)

## 2016-02-27 NOTE — Progress Notes (Signed)
   Department of Radiation Oncology  Phone:  [phone] Fax:        [phone]  Weekly Treatment Note    Name: Laurie Calhoun Date: 02/27/2016 MRN: [id number] DOB: 1955/03/08   Diagnosis:     ICD-9-CM ICD-10-CM   1. Malignant neoplasm of upper-outer quadrant of left breast in female, estrogen receptor positive ([place]) 174.4 C50.412    V86.0 Z17.0      Current dose: 50 Gy  Current fraction: 20   MEDICATIONS: Current Outpatient Prescriptions  Medication Sig Dispense Refill  . diphenhydramine-acetaminophen (TYLENOL PM) 25-500 MG TABS tablet Take 2 tablets by mouth at bedtime as needed (for sleep.).    Marland Kitchen hyaluronate sodium (RADIAPLEXRX) GEL Apply 1 application topically 2 (two) times daily.    . Multiple Vitamins-Minerals (CENTRUM ADULTS PO) Take by mouth.    . non-metallic deodorant Jethro Poling) MISC Apply 1 application topically daily as needed.    . pseudoephedrine-guaifenesin (MUCINEX D) 60-600 MG 12 hr tablet Take 1 tablet by mouth every 12 (twelve) hours.    . Vitamin D, Ergocalciferol, (DRISDOL) 50000 units CAPS capsule Take 50,000 Units by mouth every Monday.      No current facility-administered medications for this encounter.      ALLERGIES: Penicillins   LABORATORY DATA:  Lab Results  Component Value Date   WBC 4.9 12/15/2015   HGB 13.4 12/15/2015   HCT 40.4 12/15/2015   MCV 92.1 12/15/2015   PLT 302 12/15/2015   Lab Results  Component Value Date   NA 141 12/15/2015   K 4.0 12/15/2015   CL 110 12/09/2014   CO2 27 12/15/2015   Lab Results  Component Value Date   ALT 14 12/15/2015   AST 11 12/15/2015   ALKPHOS 70 12/15/2015   BILITOT 0.67 12/15/2015     NARRATIVE: Laurie Calhoun was seen today for weekly treatment management. The chart was checked and the patient's films were reviewed.  The patient completed her final fraction of radiation treatment today. She states she has done well overall. Some continued irritation of the skin with  some degree of increase but no major changes.  Weekly rad txs  lrft breast 20/20 completed, erythema,  Skin intact,  Has some bumps on breast, uses radiaplex bid,  Gave FYYN flyer  , appetite good,,  Will start lotion with vitamin e in a couple weeks 5:45 PM BP 110/60 (BP Location: Right Arm, Patient Position: Sitting, Cuff Size: Normal)   Pulse 80   Temp 98.3 F (36.8 C) (Oral)   Resp 16   Wt 180 lb 12.8 oz (82 kg)   BMI 29.18 kg/m   Wt Readings from Last 3 Encounters:  02/25/16 180 lb 12.8 oz (82 kg)  02/18/16 183 lb 6.4 oz (83.2 kg)  02/04/16 182 lb 3.2 oz (82.6 kg)    PHYSICAL EXAMINATION: weight is 180 lb 12.8 oz (82 kg). Her oral temperature is 98.3 F (36.8 C). Her blood pressure is 110/60 and her pulse is 80. Her respiration is 16.      Radiation dermatitis/erythema in the treatment area. Overall her skin looks good with no moist desquamation.  ASSESSMENT: The patient is doing satisfactorily with treatment.  She finished her final fraction of radiation treatment today.  PLAN:   The patient will follow-up in our clinic in approximately one month.

## 2016-02-28 NOTE — Progress Notes (Signed)
  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     Indication for treatment:  Curative       Radiation treatment dates:   01/31/2016 to 02/25/2016  Site/dose:    1. The Left breast was treated to 42.5 Gy in 17 fractions at 2.5 Gy per fraction. 2. The Left breast was boosted to 7.5 in 3 fractions at 2.5 Gy per fraction.   Beams/energy:    1. 3D // 10X 2. Isodose plan // 10X  Narrative: The patient tolerated radiation treatment relatively well.   The patient developed radiation dermatitis/erythema in the treatment area. Overall her skin looks good with no moist desquamation.   Plan: The patient has completed radiation treatment. The patient will return to radiation oncology clinic for routine followup in one month. I advised them to call or return sooner if they have any questions or concerns related to their recovery or treatment.  ------------------------------------------------  Jodelle Gross, MD, PhD  This document serves as a record of services personally performed by Kyung Rudd, MD. It was created on his behalf by Arlyce Harman, a trained medical scribe. The creation of this record is based on the scribe's personal observations and the provider's statements to them. This document has been checked and approved by the attending provider.

## 2016-03-03 NOTE — Progress Notes (Signed)
[place]  [phone] [phone]  Clinic Follow Up Note   Patient Care Team: Harlan Stains, MD as PCP - General (Family Medicine) Nicholas Lose, MD as Consulting Physician (Hematology and Oncology) Kyung Rudd, MD as Consulting Physician (Radiation Oncology) Rolm Bookbinder, MD as Consulting Physician (General Surgery) 03/06/2016  CHIEF COMPLAINTS/PURPOSE OF CONSULTATION:  Left breast cancer   Oncology History   Cancer Staging Malignant neoplasm of upper-outer quadrant of left female breast [facility]) Staging form: Breast, AJCC 7th Edition - Clinical stage from 12/15/2015: Stage Unknown ([place], N0, M0) - Unsigned Staging form: Breast, AJCC 8th Edition - Pathologic stage from 03/05/2016: Stage IA (pT1c, pN0, cM0, G1, ER: Positive, PR: Positive, HER2: Negative, Oncotype DX score: 8) - Signed by Truitt Merle, MD on 03/05/2016       Malignant neoplasm of upper-outer quadrant of left female breast ([place])   11/30/2015 Mammogram    Diagnostic mammogram of left breast showed persistent left breast architectural distortion in the upper outer quadrant, no responding findings on ultrasound, left axilla was negative on ultrasound.      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      12/01/2015 Receptors her2    ER 90% positive, PR 100% positive, strong staining, HER-2 negative, Ki-67 5%       12/21/2015 Surgery    Left breast lumpectomy and sentinel lymph node biopsy.      12/21/2015 Pathology Results    Left breast lumpectomy showed invasive ductal carcinoma with calcifications, grade 1, 1.4 cm, low-grade DCIS, surgical margins were negative (greater than 0.2cm). 2 sentinel lymph nodes were negative. No lymphovascular invasion.      12/21/2015 Oncotype testing    Recurrence score 8, which predicts 10 year risk of distant recurrence 6% with tamoxifen.      01/31/2016 - 02/25/2016 Radiation Therapy    1. The Left breast was treated to 42.5 Gy in 17 fractions at 2.5 Gy per fraction. 2. The Left breast was boosted to 7.5 in 3 fractions at 2.5 Gy per fraction.       HISTORY OF PRESENTING ILLNESS:  Laurie Calhoun 61 y.o. female is here because of her recently diagnosed left breast cancer. She is accompanied by her husband to our multidisciplinary breast clinic today.  This was discovered by screening mammogram on 11/25/2015, which showed architectural distortion in the left breast. Her previous last mammogram was done in June 2013 and it was normal. She had no palpable breast mass or lymphadenopathy, denies any constitutional symptoms. She underwent diagnostic mammogram which showed a persistent architectural distortion in the upper outer quadrant of left breast, but responding findings on ultrasound. She underwent left breast core needle biopsy which showed invasive ductal carcinoma, grade 1, ER/PR stripe positive, HER-2 negative.  She feels well, denies any significant pain, or other symptoms. She has no significant past medical history, she did have hysterectomy and BSO about 15-20 years ago for fibroids. Family history is positive for breast cancer in her cousin at age of 63. 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  GYN HISTORY  Menarchal: 58 LMP: 19 (hysterectomy and BSO)  Contraceptive:no  HRT: yes, 15 years, stopped in 11/2015 G1P0: She had a 1 topical pregnancy. She has 2 adopted children.  CURRENT THERAPY: pending Letrozole  INTERIM HISTORY: Laurie Calhoun returns today for follow up. She had a lumpectomy and finished radiation last week. She feels well. Her skin sensitivity is much better, she was able to shower without pain for the first time yesterday. She has a lot of fatigue from radiation.  She still has some skin blisters from radiation. She is still working. She tore the meniscus in her left knee year ago and has a lot of cracking that happens in her knees when she walks. Denies joint pain, or any other concerns.   MEDICAL HISTORY:  Past Medical History:  Diagnosis Date  . Allergy   . Breast cancer ([place]) 12/01/2015   Left Breast  . GERD (gastroesophageal reflux disease)   . 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 History of kidney stones   . Malignant neoplasm of upper-outer quadrant of left female breast ([place]) 12/07/2015    SURGICAL HISTORY: Past Surgical History:  Procedure Laterality Date  . ABDOMINAL HYSTERECTOMY    . BREAST LUMPECTOMY WITH RADIOACTIVE SEED AND SENTINEL LYMPH NODE BIOPSY Left 12/21/2015   Procedure: LEFT BREAST LUMPECTOMY WITH RADIOACTIVE SEED AND SENTINEL LYMPH NODE BIOPSY;  Surgeon: Rolm Bookbinder, MD;  Location: [facility];  Service: General;  Laterality: Left;  . BREAST SURGERY     breast lumpectomy ?2000   . COLONOSCOPY    . PILONIDAL CYST EXCISION      SOCIAL HISTORY: Social History   Social History  . Marital status: Married    Spouse name: N/A  . Number of children: N/A  . Years of education: N/A   Occupational History  . Not on file.   Social History Main Topics  . Smoking status: Never Smoker  . Smokeless tobacco: Never Used  . Alcohol use 1.2 oz/week    2 Glasses of wine per week  . Drug use: No  . Sexual activity: Yes   Other Topics Concern  . Not on file   Social History Narrative  . No narrative on file    FAMILY HISTORY: Family History  Problem Relation Age of Onset  . Lung cancer Mother   . Cancer Mother     lung cancer  . Cancer Father     GI cancer   . Cancer Cousin 70    breast cancer     ALLERGIES:  is allergic to penicillins.  MEDICATIONS:  Current Outpatient Prescriptions  Medication Sig Dispense Refill  . diphenhydramine-acetaminophen (TYLENOL PM) 25-500 MG TABS tablet Take 2 tablets by  mouth at bedtime as needed (for sleep.).    Marland Kitchen Multiple Vitamins-Minerals (CENTRUM ADULTS PO) Take by mouth.    . Vitamin D, Ergocalciferol, (DRISDOL) 50000 units CAPS capsule Take 50,000 Units by mouth every Monday.      No current facility-administered medications for this visit.     REVIEW OF SYSTEMS:   Constitutional: Denies fevers, chills or abnormal night sweats (+) fatigue Eyes: Denies blurriness of vision, double vision or watery eyes Ears, nose, mouth, throat, and face: Denies mucositis or sore throat Respiratory: Denies cough, dyspnea or wheezes Cardiovascular: Denies palpitation, chest discomfort or lower extremity swelling Gastrointestinal:  Denies nausea, heartburn or change in bowel habits Skin: Denies abnormal skin rashes (+) blisters at radiation site Lymphatics: Denies new lymphadenopathy or easy bruising Neurological:Denies numbness, tingling or new weaknesses Behavioral/Psych: Mood is stable, no new changes  All other systems were reviewed with the patient and are negative.  PHYSICAL EXAMINATION: ECOG PERFORMANCE STATUS: 0 -  Asymptomatic  Vitals:   03/06/16 0940  BP: 124/79  Pulse: 84  Resp: 18  Temp: 98.1 F (36.7 C)   Filed Weights   03/06/16 0940  Weight: 179 lb 4.8 oz (81.3 kg)   GENERAL:alert, no distress and comfortable SKIN: skin color, texture, turgor are normal, no rashes or significant lesions EYES: normal, conjunctiva are pink and non-injected, sclera clear OROPHARYNX:no exudate, no erythema and lips, buccal mucosa, and tongue normal  NECK: supple, thyroid normal size, non-tender, without nodularity LYMPH:  no palpable lymphadenopathy in the cervical, axillary or inguinal LUNGS: clear to auscultation and percussion with normal breathing effort HEART: regular rate & rhythm and no murmurs and no lower extremity edema ABDOMEN:abdomen soft, non-tender and normal bowel sounds Musculoskeletal:no cyanosis of digits and no clubbing  PSYCH: alert &  oriented x 3 with fluent speech NEURO: no focal motor/sensory deficits Breasts: Breast inspection showed them to be symmetrical with no nipple discharge. Palpation of the breasts and axilla revealed no obvious mass that I could appreciate. (+) s/p L lumpectomy. Incision site healing well. Skin pigment changes due to radiation.   LABORATORY DATA:  I have reviewed the data as listed CBC Latest Ref Rng & Units 12/15/2015 12/09/2014  WBC 3.9 - 10.3 10e3/uL 4.9 7.3  Hemoglobin 11.6 - 15.9 g/dL 13.4 12.6  Hematocrit 34.8 - 46.6 % 40.4 37.9  Platelets 145 - 400 10e3/uL 302 274   CMP Latest Ref Rng & Units 12/15/2015 12/09/2014 09/19/2009  Glucose 70 - 140 mg/dl 99 152(H) 118(H)  BUN 7.0 - 26.0 mg/dL 13.7 19 18   Creatinine 0.6 - 1.1 mg/dL 0.8 0.83 0.7  Sodium 136 - 145 mEq/L 141 140 140  Potassium 3.5 - 5.1 mEq/L 4.0 4.0 3.9  Chloride 101 - 111 mmol/L - 110 102  CO2 22 - 29 mEq/L 27 25 28   Calcium 8.4 - 10.4 mg/dL 9.1 8.5(L) 9.3  Total Protein 6.4 - 8.3 g/dL 7.1 7.0 -  Total Bilirubin 0.20 - 1.20 mg/dL 0.67 0.5 -  Alkaline Phos 40 - 150 U/L 70 66 -  AST 5 - 34 U/L 11 17 -  ALT 0 - 55 U/L 14 19 -   PATHOLOGY REPORT  Oncotype 12/21/2015 Diagnosis 12/21/2015 1. Breast, lumpectomy, Left - INVASIVE DUCTAL CARCINOMA WITH CALCIFICATIONS, GRADE I/III, SPANNING 1.4 CM. - DUCTAL CARCINOMA IN SITU WITH CALCIFICATIONS, LOW GRADE. - THE SURGICAL RESECTION MARGINS ARE NEGATIVE FOR CARCINOMA. - SEE ONCOLOGY TABLE BELOW. 2. Lymph node, sentinel, biopsy, Left axillary - THERE IS NO EVIDENCE OF CARCINOMA IN 1 OF 1 LYMPH NODE (0/1). 3. Lymph node, sentinel, biopsy, Left axillary - THERE IS NO EVIDENCE OF CARCINOMA IN 1 OF 1 LYMPH NODE (0/1). 4. Breast, excision, Left superior margin - BENIGN ADIPOSE TISSUE. - THERE IS NO EVIDENCE OF MALIGNANCY. - SEE COMMENT., 5. Breast, excision, Left inferior margin - FIBROCYSTIC CHANGES - THERE IS NO EVIDENCE OF MALIGNANCY. - SEE COMMENT. 6. Breast, excision,  Left medial margin - FIBROCYSTIC CHANGES - THERE IS NO EVIDENCE OF MALIGNANCY. - SEE COMMENT. Microscopic Comment 1. BREAST, INVASIVE TUMOR, WITH LYMPH NODES PRESENT Specimen, including laterality and lymph node sampling (sentinel, non-sentinel): Left breast and left axillary lymph nodes Procedure: Seed localized lumpectomy, additional margin resections and axillary lymph node resections. Histologic type: Ductal Grade: I Tubule formation: 1  Nuclear pleomorphism: 2 Mitotic: 1 Tumor size (gross measurement: 1.4 cm Margins: Greater than 0.2 cm to all margins. Lymphovascular invasion: Not identified Ductal carcinoma in situ: Present Grade: Low grade Extensive  intraductal component: No Lobular neoplasia: Not identified Tumor focality: Unifocal Extent of tumor: Confined to breast parenchyma. Lymph nodes: Examined: 2 Sentinel 0 Non-sentinel 2 Total Lymph nodes with metastasis: 0 Breast prognostic profile: Case [id number] Estrogen receptor: 90%, strong Progesterone receptor: 100%, strong Her 2 neu: No amplification was detected. The ratio was 1.05 Ki-67: 5% Non-neoplastic breast: Fibrocystic changes. TNM: pT1c, pN0 (JBK:kh 12-23-15)  Diagnosis 12/01/2015 Breast, left, needle core biopsy, upper outer - INVASIVE DUCTAL CARCINOMA. - ATYPICAL DUCTAL HYPERPLASIA. - SEE COMMENT. Microscopic Comment The carcinoma appears grade 1. A breast prognostic profile will be performed and the results reported separately. The results were called to the [place] on 12/03/2015. (JBK:k 12/03/15) Results: IMMUNOHISTOCHEMICAL AND MORPHOMETRIC ANALYSIS PERFORMED MANUALLY Estrogen Receptor: 90%, POSITIVE, STRONG STAINING INTENSITY Progesterone Receptor: 100%, POSITIVE, STRONG STAINING INTENSITY Proliferation Marker Ki67: 5%  Results: HER2 - NEGATIVE RATIO OF HER2/CEP17 SIGNALS 1.05 AVERAGE HER2 COPY NUMBER PER CELL 1.95  RADIOGRAPHIC STUDIES: I have personally reviewed  the radiological images as listed and agreed with the findings in the report.  US Breast LTD L breast 12/21/2015 IMPRESSION: 5 cm normal appearing intramammary lymph node in the 2 o'clock position of the left breast, corresponding to the mass seen on the recent MR.  MRI Breast b/l 12/18/2015 IMPRESSION: 1. The known malignancy in the upper-outer left breast measures up to 1.4 cm by MRI.  2. There is an indeterminate 0.5 cm oval mass that lies approximately 2 cm posterior to the known malignancy. The imaging characteristics suggest this may represent a lymph node, however further investigation is warranted.  3.  No MRI evidence of malignancy in the right breast.  [place] L breast 11/30/2015 IMPRESSION: Persistent left breast architectural distortion.  Diagnostic Mammogram L breast 11/30/2015 IMPRESSION: Persistent left breast architectural distortion.  Screening Mammogram b/l 11/23/2015 IMPRESSION: Further evaluation is suggested for possible mass in the left breast.  ASSESSMENT & PLAN:  61 y.o. Caucasian female, without significant past medical history, presented with screening discomfort of left breast cancer  1. Malignant neoplasm of upper-outer quadrant of left breast, invasive ductal carcinoma, grade 1, cTxN0M0, ER+/PR+/HER2- --I reviewed her surgical pathology findings with patient -She had low-grade early stage breast cancer, surgical margins were negative, 2 sentinel lymph nodes were negative. -I reviewed the Oncotype result with the patient. She has a recurrence score of 8; I do not recommend chemotherapy.  -She has low risk disease. Her prognosis is excellent. -She has completed radiation with Dr. Lisbeth Renshaw on 02/25/16.  -Giving the strong ER and PR expression in her postmenopausal status, I recommend adjuvant endocrine therapy with aromatase inhibitor for a total of 5 years --The potential benefit and side effects, which includes but not limited to, hot flash, skin and  vaginal dryness, metabolic changes ( increased blood glucose, cholesterol, weight, etc.), slightly in increased risk of cardiovascular disease, cataracts, muscular and joint discomfort, osteopenia and osteoporosis, etc, were discussed with her in great details. She is interested, and we'll start in a few weeks -We again discussed the breast cancer surveillance after her surgery. She will continue annual screening mammogram, self exam, and a routine office visit with lab and exam with [place]. -I encouraged her to have healthy diet and exercise regularly.  -Start on Letrozole in a few weeks. I have given her some reading material for this.   2. Bone Health -her last DEXA scan was in 2011.  -Order repeat DEXA scan -We discussed that the letrozole may cause osteopenia and osteoporosis -She takes  vitamin D, and will start taking calcium as well.   Plan -Order repeat DEXA scan -Start Letrozole in a few weeks.  -Return in 2-3 months.    All questions were answered. The patient knows to call the clinic with any problems, questions or concerns.  I spent 25 minutes counseling the patient face to face. The total time spent in the appointment was 30 minutes and more than 50% was on counseling.  This document serves as a record of services personally performed by Truitt Merle, MD. It was created on her behalf by Martinique Casey, a trained medical scribe. The creation of this record is based on the scribe's personal observations and the provider's statements to them. This document has been checked and approved by the attending provider.  I have reviewed the above documentation for accuracy and completeness and I agree with the above.   Martinique M Casey 03/06/2016 9:44 AM

## 2016-03-06 ENCOUNTER — Ambulatory Visit (HOSPITAL_BASED_OUTPATIENT_CLINIC_OR_DEPARTMENT_OTHER): Payer: Managed Care, Other (non HMO) | Admitting: Hematology

## 2016-03-06 ENCOUNTER — Telehealth: Payer: Self-pay | Admitting: *Deleted

## 2016-03-06 ENCOUNTER — Encounter: Payer: Self-pay | Admitting: Hematology

## 2016-03-06 VITALS — BP 124/79 | HR 84 | Temp 98.1°F | Resp 18 | Ht 66.0 in | Wt 179.3 lb

## 2016-03-06 DIAGNOSIS — Z170 Estrogen receptor positive status [ER+]: Secondary | ICD-10-CM | POA: Diagnosis not present

## 2016-03-06 DIAGNOSIS — C50412 Malignant neoplasm of upper-outer quadrant of left female breast: Secondary | ICD-10-CM | POA: Diagnosis not present

## 2016-03-06 DIAGNOSIS — Z780 Asymptomatic menopausal state: Secondary | ICD-10-CM

## 2016-03-06 MED ORDER — LETROZOLE 2.5 MG PO TABS: 2.5000 mg | ORAL_TABLET | Freq: Every day | ORAL | 2 refills | Status: DC

## 2016-03-06 NOTE — Telephone Encounter (Signed)
Called patient to alter fu appt. On 04-11-16 to 04-04-16 @ 2 pm, spoke with patient and she agreed to this new fu appt.

## 2016-03-20 ENCOUNTER — Telehealth: Payer: Self-pay | Admitting: Hematology

## 2016-03-20 NOTE — Telephone Encounter (Signed)
Left message for patient re April and May appointments at [facility] and dexa scan at [facility] 03/28/16. Schedule mailed.

## 2016-03-21 NOTE — Progress Notes (Addendum)
Laurie Calhoun 60 y.o. woman with Stage IA, T1c, N0, M0 ER/PR positive ductal carcinoma of the left breast with DCIS radiation completed 02-25-16 one month FU.  Skin status: What lotion are you using?  Have you seen med onc? If not, when is appointment: 03-06-16 Dr. Burr Medico If they are ER+, have they started Al or Tamoxifen? If not, why? 03-06-16 Letrozole  Discuss survivorship appointment. 04-20-16 [place] Have you had a mammogram scheduled? Not scheduled yet Offer referral to [facility]. Will receive at the survivorship appointment. Oncotype Dx. Score:Recurrence score 8, which predicts 10 year risk of distant recurrence 6% Appetite: Pain: Fatigue: Arm mobility: Lymphedema

## 2016-03-23 ENCOUNTER — Telehealth: Payer: Self-pay | Admitting: Hematology

## 2016-03-23 NOTE — Telephone Encounter (Signed)
Spoke with patient about the May appointments and also confirm her appointment with Wilber Bihari (survivorship program) and she wanted to cancel and get the care plan mailed to her.  I canceled her appointment per patient request.

## 2016-03-28 ENCOUNTER — Other Ambulatory Visit: Payer: Managed Care, Other (non HMO)

## 2016-03-30 ENCOUNTER — Telehealth: Payer: Self-pay | Admitting: Emergency Medicine

## 2016-03-30 NOTE — Telephone Encounter (Signed)
Patient called to cancel upcoming appointment with [facility] in [place]; patient states that the bills keep coming in and that she would like to hold off on coming in for that appointment at this time. Advised her of upcoming bone density scan and lab and Dr Burr Medico appointments. Encouraged her to call for any concerns in the meantime. Patient verbalized understanding.

## 2016-04-03 ENCOUNTER — Ambulatory Visit
Admission: RE | Admit: 2016-04-03 | Discharge: 2016-04-03 | Disposition: A | Discharge status: Home / Self Care | Payer: Managed Care, Other (non HMO) | Source: Ambulatory Visit | Attending: Radiation Oncology | Admitting: Radiation Oncology

## 2016-04-04 ENCOUNTER — Ambulatory Visit: Payer: Self-pay | Admitting: Radiation Oncology

## 2016-04-06 ENCOUNTER — Other Ambulatory Visit: Payer: Managed Care, Other (non HMO)

## 2016-04-11 ENCOUNTER — Ambulatory Visit: Payer: Self-pay | Admitting: Radiation Oncology

## 2016-04-11 ENCOUNTER — Ambulatory Visit
Admission: RE | Admit: 2016-04-11 | Discharge: 2016-04-11 | Disposition: A | Discharge status: Home / Self Care | Payer: Managed Care, Other (non HMO) | Source: Ambulatory Visit | Attending: Hematology | Admitting: Hematology

## 2016-04-11 DIAGNOSIS — Z780 Asymptomatic menopausal state: Secondary | ICD-10-CM

## 2016-04-20 ENCOUNTER — Encounter: Payer: Managed Care, Other (non HMO) | Admitting: Adult Health

## 2016-05-03 ENCOUNTER — Other Ambulatory Visit: Payer: Self-pay | Admitting: *Deleted

## 2016-05-03 MED ORDER — LETROZOLE 2.5 MG PO TABS: 2.5000 mg | ORAL_TABLET | Freq: Every day | ORAL | 3 refills | Status: DC

## 2016-06-01 NOTE — Progress Notes (Signed)
[place]  [phone] [phone]  Clinic Follow Up Note   Patient Care Team: Harlan Stains, MD as PCP - General (Family Medicine) Kyung Rudd, MD as Consulting Physician (Radiation Oncology) Rolm Bookbinder, MD as Consulting Physician (General Surgery) Delice Bison, Charlestine Massed, NP as Nurse Practitioner (Hematology and Oncology) Truitt Merle, MD as Consulting Physician (Hematology) 06/02/2016  CHIEF COMPLAINTS/PURPOSE OF CONSULTATION:  Left breast cancer   Oncology History   Cancer Staging Malignant neoplasm of upper-outer quadrant of left female breast [facility]) Staging form: Breast, AJCC 7th Edition - Clinical stage from 12/15/2015: Stage Unknown ([place], N0, M0) - Unsigned Staging form: Breast, AJCC 8th Edition - Pathologic stage from 03/05/2016: Stage IA (pT1c, pN0, cM0, G1, ER: Positive, PR: Positive, HER2: Negative, Oncotype DX score: 8) - Signed by Truitt Merle, MD on 03/05/2016       Malignant neoplasm of upper-outer quadrant of left female breast ([place])   11/30/2015 Mammogram    Diagnostic mammogram of left breast showed persistent left breast architectural distortion in the upper outer quadrant, no responding findings on ultrasound, left axilla was negative on ultrasound.      12/01/2015 Initial Diagnosis    Malignant neoplasm of upper-outer quadrant of left female breast ([place])      12/01/2015 Initial Biopsy    Left breast core needle biopsy in the upper outer quadrant showed invasive ductal carcinoma, G1, and atypical ductal hyperplasia.      12/01/2015 Receptors her2    ER 90% positive, PR 100% positive, strong staining, HER-2 negative, Ki-67 5%       12/21/2015 Surgery    Left breast lumpectomy and sentinel lymph node biopsy.      12/21/2015 Pathology Results    Left breast lumpectomy showed invasive ductal carcinoma with calcifications, grade 1, 1.4 cm, low-grade DCIS, surgical margins were negative (greater than 0.2cm). 2  sentinel lymph nodes were negative. No lymphovascular invasion.      12/21/2015 Oncotype testing    Recurrence score 8, which predicts 10 year risk of distant recurrence 6% with tamoxifen.      01/31/2016 - 02/25/2016 Radiation Therapy    1. The Left breast was treated to 42.5 Gy in 17 fractions at 2.5 Gy per fraction. 2. The Left breast was boosted to 7.5 in 3 fractions at 2.5 Gy per fraction.      03/2016 -  Anti-estrogen oral therapy    Letrozole 2.95m daily, planned treatment of 5 years       HISTORY OF PRESENTING ILLNESS:  DHaislee Corso61y.o. female is here because of her recently diagnosed left breast cancer. She is accompanied by her husband to our multidisciplinary breast clinic today.  This was discovered by screening mammogram on 11/25/2015, which showed architectural distortion in the left breast. Her previous last mammogram was done in June 2013 and it was normal. She had no palpable breast mass or lymphadenopathy, denies any constitutional symptoms. She underwent diagnostic mammogram which showed a persistent architectural distortion in the upper outer quadrant of left breast, but responding findings on ultrasound. She underwent left breast core needle biopsy which showed invasive ductal carcinoma, grade 1, ER/PR stripe positive, HER-2 negative.  She feels well, denies any significant pain, or other symptoms. She has no significant past medical history, she did have hysterectomy and BSO about 15-20 years ago for fibroids. 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 She has 2 adopted children.  CURRENT THERAPY: Letrozole started 03/2016  INTERIM HISTORY: Ms. Paternostro returns today for follow up. She would like to change Letrozole  because it causes joint pain. Her toes curl and she has pain when getting out of a chair which she has been experiencing since starting. She has also been tired. Her left breast has been feeling "heavy." She denied hot flashes or mood swings, but she does get warm at night while sleeping.   MEDICAL HISTORY:  Past Medical History:  Diagnosis Date  . Allergy   . Breast cancer ([place]) 12/01/2015   Left Breast  . GERD (gastroesophageal reflux disease)   . 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 History of kidney stones   . Malignant neoplasm of upper-outer quadrant of left female breast ([place]) 12/07/2015    SURGICAL HISTORY: Past Surgical History:  Procedure Laterality Date  . ABDOMINAL HYSTERECTOMY    . BREAST LUMPECTOMY WITH RADIOACTIVE SEED AND SENTINEL LYMPH NODE BIOPSY Left 12/21/2015   Procedure: LEFT BREAST LUMPECTOMY WITH RADIOACTIVE SEED AND SENTINEL LYMPH NODE BIOPSY;  Surgeon: Rolm Bookbinder, MD;  Location: [place];  Service: General;  Laterality: Left;  . BREAST SURGERY     breast lumpectomy ?2000   . COLONOSCOPY    . PILONIDAL CYST EXCISION      SOCIAL HISTORY: Social History   Social History  . Marital status: Married    Spouse name: N/A  . Number of children: N/A  . Years of education: N/A   Occupational History  . Not on file.   Social History Main Topics  . Smoking status: Never Smoker  . Smokeless tobacco: Never Used  . Alcohol use 1.2 oz/week    2 Glasses of wine per week  . Drug use: No  . Sexual activity: Yes   Other Topics Concern  . Not on file   Social History Narrative  . No narrative on file    FAMILY HISTORY: Family History  Problem Relation Age of Onset  . Lung cancer Mother   . Cancer Mother        lung cancer  . Cancer Father        GI cancer   . Cancer Cousin 63       breast cancer     ALLERGIES:  is allergic to penicillins.  MEDICATIONS:  Current Outpatient Prescriptions  Medication Sig Dispense Refill  .  diphenhydramine-acetaminophen (TYLENOL PM) 25-500 MG TABS tablet Take 2 tablets by mouth at bedtime as needed (for sleep.).    Marland Kitchen letrozole (FEMARA) 2.5 MG tablet Take 1 tablet (2.5 mg total) by mouth daily. 90 tablet 3  . Multiple Vitamins-Minerals (CENTRUM ADULTS PO) Take by mouth.    . Vitamin D, Ergocalciferol, (DRISDOL) 50000 units CAPS capsule Take 50,000 Units by mouth every Monday.     . tamoxifen (NOLVADEX) 20 MG tablet Take 1 tablet (20 mg total) by mouth daily. 30 tablet 2   No current facility-administered medications for this visit.     REVIEW OF SYSTEMS:    Constitutional: Denies fevers, chills or abnormal night sweats (+) fatigue Eyes: Denies blurriness of vision, double vision or watery eyes Ears, nose, mouth, throat, and face: Denies mucositis or sore throat Respiratory: Denies cough, dyspnea or wheezes Cardiovascular: Denies palpitation, chest discomfort or lower extremity swelling  Gastrointestinal:  Denies nausea, heartburn or change in bowel habits Skin: Denies abnormal skin rashes  Lymphatics: Denies new lymphadenopathy or easy bruising Neurological:Denies numbness, tingling or new weaknesses Behavioral/Psych: Mood is stable, no new changes  Musculoskeletal: (+) joint pains  All other systems were reviewed with the patient and are negative.  PHYSICAL EXAMINATION:  ECOG PERFORMANCE STATUS: 0 - Asymptomatic  Vitals:   06/02/16 1014  BP: (!) 142/72  Pulse: 74  Resp: 18  Temp: 98.3 F (36.8 C)   Filed Weights   06/02/16 1014  Weight: 179 lb 9.6 oz (81.5 kg)   GENERAL:alert, no distress and comfortable SKIN: skin color, texture, turgor are normal, no rashes or significant lesions EYES: normal, conjunctiva are pink and non-injected, sclera clear OROPHARYNX:no exudate, no erythema and lips, buccal mucosa, and tongue normal  NECK: supple, thyroid normal size, non-tender, without nodularity LYMPH:  no palpable lymphadenopathy in the cervical, axillary or  inguinal LUNGS: clear to auscultation and percussion with normal breathing effort HEART: regular rate & rhythm and no murmurs and no lower extremity edema ABDOMEN:abdomen soft, non-tender and normal bowel sounds Musculoskeletal:no cyanosis of digits and no clubbing  PSYCH: alert & oriented x 3 with fluent speech NEURO: no focal motor/sensory deficits Breasts: Breast inspection showed them to be symmetrical with no nipple discharge. Palpation of the breasts and axilla revealed no obvious mass that I could appreciate. (+) s/p L lumpectomy. Incision site healing well. Skin pigment changes due to radiation.   LABORATORY DATA:  I have reviewed the data as listed CBC Latest Ref Rng & Units 06/02/2016 12/15/2015 12/09/2014  WBC 3.9 - 10.3 10e3/uL 5.2 4.9 7.3  Hemoglobin 11.6 - 15.9 g/dL 13.3 13.4 12.6  Hematocrit 34.8 - 46.6 % 38.7 40.4 37.9  Platelets 145 - 400 10e3/uL 256 302 274   CMP Latest Ref Rng & Units 06/02/2016 12/15/2015 12/09/2014  Glucose 70 - 140 mg/dl 106 99 152(H)  BUN 7.0 - 26.0 mg/dL 19.2 13.7 19  Creatinine 0.6 - 1.1 mg/dL 0.8 0.8 0.83  Sodium 136 - 145 mEq/L 144 141 140  Potassium 3.5 - 5.1 mEq/L 4.2 4.0 4.0  Chloride 101 - 111 mmol/L - - 110  CO2 22 - 29 mEq/L 27 27 25   Calcium 8.4 - 10.4 mg/dL 9.2 9.1 8.5(L)  Total Protein 6.4 - 8.3 g/dL 7.0 7.1 7.0  Total Bilirubin 0.20 - 1.20 mg/dL 0.58 0.67 0.5  Alkaline Phos 40 - 150 U/L 83 70 66  AST 5 - 34 U/L 16 11 17   ALT 0 - 55 U/L 23 14 19    PATHOLOGY REPORT  Oncotype 12/21/2015 Diagnosis 12/21/2015 1. Breast, lumpectomy, Left - INVASIVE DUCTAL CARCINOMA WITH CALCIFICATIONS, GRADE I/III, SPANNING 1.4 CM. - DUCTAL CARCINOMA IN SITU WITH CALCIFICATIONS, LOW GRADE. - THE SURGICAL RESECTION MARGINS ARE NEGATIVE FOR CARCINOMA. - SEE ONCOLOGY TABLE BELOW. 2. Lymph node, sentinel, biopsy, Left axillary - THERE IS NO EVIDENCE OF CARCINOMA IN 1 OF 1 LYMPH NODE (0/1). 3. Lymph node, sentinel, biopsy, Left axillary - THERE IS NO  EVIDENCE OF CARCINOMA IN 1 OF 1 LYMPH NODE (0/1). 4. Breast, excision, Left superior margin - BENIGN ADIPOSE TISSUE. - THERE IS NO EVIDENCE OF MALIGNANCY. - SEE COMMENT., 5. Breast, excision, Left inferior margin - FIBROCYSTIC CHANGES - THERE IS NO EVIDENCE OF MALIGNANCY. - SEE COMMENT. 6. Breast, excision, Left medial margin - FIBROCYSTIC CHANGES - THERE IS NO EVIDENCE OF MALIGNANCY. - SEE COMMENT. Microscopic Comment 1. BREAST, INVASIVE TUMOR, WITH LYMPH NODES PRESENT Specimen,  including laterality and lymph node sampling (sentinel, non-sentinel): Left breast and left axillary lymph nodes Procedure: Seed localized lumpectomy, additional margin resections and axillary lymph node resections. Histologic type: Ductal Grade: I Tubule formation: 1  Nuclear pleomorphism: 2 Mitotic: 1 Tumor size (gross measurement: 1.4 cm Margins: Greater than 0.2 cm to all margins. Lymphovascular invasion: Not identified Ductal carcinoma in situ: Present Grade: Low grade Extensive intraductal component: No Lobular neoplasia: Not identified Tumor focality: Unifocal Extent of tumor: Confined to breast parenchyma. Lymph nodes: Examined: 2 Sentinel 0 Non-sentinel 2 Total Lymph nodes with metastasis: 0 Breast prognostic profile: Case [id number] Estrogen receptor: 90%, strong Progesterone receptor: 100%, strong Her 2 neu: No amplification was detected. The ratio was 1.05 Ki-67: 5% Non-neoplastic breast: Fibrocystic changes. TNM: pT1c, pN0 (JBK:kh 12-23-15)  Diagnosis 12/01/2015 Breast, left, needle core biopsy, upper outer - INVASIVE DUCTAL CARCINOMA. - ATYPICAL DUCTAL HYPERPLASIA. - SEE COMMENT. Microscopic Comment The carcinoma appears grade 1. A breast prognostic profile will be performed and the results reported separately. The results were called to the [place] on 12/03/2015. (JBK:k 12/03/15) Results: IMMUNOHISTOCHEMICAL AND MORPHOMETRIC ANALYSIS PERFORMED  MANUALLY Estrogen Receptor: 90%, POSITIVE, STRONG STAINING INTENSITY Progesterone Receptor: 100%, POSITIVE, STRONG STAINING INTENSITY Proliferation Marker Ki67: 5%  Results: HER2 - NEGATIVE RATIO OF HER2/CEP17 SIGNALS 1.05 AVERAGE HER2 COPY NUMBER PER CELL 1.95  RADIOGRAPHIC STUDIES: I have personally reviewed the radiological images as listed and agreed with the findings in the report.  US Breast LTD L breast 12/21/2015 IMPRESSION: 5 cm normal appearing intramammary lymph node in the 2 o'clock position of the left breast, corresponding to the mass seen on the recent MR.  MRI Breast b/l 12/18/2015 IMPRESSION: 1. The known malignancy in the upper-outer left breast measures up to 1.4 cm by MRI.  2. There is an indeterminate 0.5 cm oval mass that lies approximately 2 cm posterior to the known malignancy. The imaging characteristics suggest this may represent a lymph node, however further investigation is warranted.  3.  No MRI evidence of malignancy in the right breast.  [place] L breast 11/30/2015 IMPRESSION: Persistent left breast architectural distortion.  Diagnostic Mammogram L breast 11/30/2015 IMPRESSION: Persistent left breast architectural distortion.  Screening Mammogram b/l 11/23/2015 IMPRESSION: Further evaluation is suggested for possible mass in the left breast.  ASSESSMENT & PLAN:  61 y.o. Caucasian female, without significant past medical history, presented with screening discomfort of left breast cancer  1. Malignant neoplasm of upper-outer quadrant of left breast, invasive ductal carcinoma, grade 1, cTxN0M0, ER+/PR+/HER2- --I reviewed her surgical pathology findings with patient -She had low-grade early stage breast cancer, surgical margins were negative, 2 sentinel lymph nodes were negative. -I reviewed the Oncotype result with the patient. She has a recurrence score of 8; I did not recommend chemotherapy.  -She has low risk disease. Her prognosis is  excellent. -She has completed radiation with Dr. Lisbeth Renshaw on 02/25/16.  --Start on adjuvant Letrozole two months ago, she has developed significant arthralgia, and could not tolerate any more. -I discussed the other options, such as exemestane and tamoxifen. Potential side effects and benefit discussed with her again. She is again concerned about a arthralgia from exemestane, and opted tamoxifen. -The potential side effects, which includes but not limited to, hot flash, skin and vaginal dryness, slightly increased risk of cardiovascular disease and cataract, small risk of thrombosis and endometrial cancer, were discussed with her in great details. Preventive strategies for thrombosis, such as being physically active, using compression stocks, avoid cigarette smoking,  etc., were reviewed with her. She voiced good understanding, and agrees to proceed. She has had hysterectomy, no risk of endometrial cancer. -I recommend her to stop her letrozole for now, and start tamoxifen in 3-4 weeks when her arthralgia resolves.  - f/u in 3 months -She is otherwise doing well, lab reviewed, exam were unremarkable, no clinical concern for recurrence. We'll continue surveillance.  2. Bone Health -recent bone density scan from 05/08/2016 was normal with T score of -0.9 at right femur neck.  -I encouraged her to continue calcium and vitamin D -We discussed with the tamoxifen strength bone and prevent osteoporosis and osteopenia.  3. Arthralgia - patient has been experiencing extreme joint pain since starting Letrozole - I advised her to stop Letrozole and switch her to Tamoxifen  Plan -She will stop letrozole, switch to Tamoxifen, she'll start in 3-4 weeks when her arthralgia resolves. - f/u in 3 months    All questions were answered. The patient knows to call the clinic with any problems, questions or concerns.  I spent 25 minutes counseling the patient face to face. The total time spent in the appointment was 30  minutes and more than 50% was on counseling.  This document serves as a record of services personally performed by Truitt Merle, MD. It was created on her behalf by Brandt Loosen, a trained medical scribe. The creation of this record is based on the scribe's personal observations and the provider's statements to them. This document has been checked and approved by the attending provider.   I have reviewed the above documentation for accuracy and completeness and I agree with the above.   Truitt Merle, MD 06/02/2016

## 2016-06-02 ENCOUNTER — Ambulatory Visit (HOSPITAL_BASED_OUTPATIENT_CLINIC_OR_DEPARTMENT_OTHER): Payer: 59 | Admitting: Hematology

## 2016-06-02 ENCOUNTER — Other Ambulatory Visit (HOSPITAL_BASED_OUTPATIENT_CLINIC_OR_DEPARTMENT_OTHER): Payer: 59

## 2016-06-02 VITALS — BP 142/72 | HR 74 | Temp 98.3°F | Resp 18 | Ht 66.0 in | Wt 179.6 lb

## 2016-06-02 DIAGNOSIS — M2550 Pain in unspecified joint: Secondary | ICD-10-CM | POA: Diagnosis not present

## 2016-06-02 DIAGNOSIS — C50412 Malignant neoplasm of upper-outer quadrant of left female breast: Secondary | ICD-10-CM | POA: Diagnosis not present

## 2016-06-02 DIAGNOSIS — Z170 Estrogen receptor positive status [ER+]: Principal | ICD-10-CM

## 2016-06-02 LAB — CBC WITH DIFFERENTIAL/PLATELET
BASO%: 0.3 % (ref 0.0–2.0)
BASOS ABS: 0 10*3/uL (ref 0.0–0.1)
EOS%: 2.2 % (ref 0.0–7.0)
Eosinophils Absolute: 0.1 10*3/uL (ref 0.0–0.5)
HEMATOCRIT: 38.7 % (ref 34.8–46.6)
HEMOGLOBIN: 13.3 g/dL (ref 11.6–15.9)
LYMPH#: 1.3 10*3/uL (ref 0.9–3.3)
LYMPH%: 25.2 % (ref 14.0–49.7)
MCH: 31.7 pg (ref 25.1–34.0)
MCHC: 34.4 g/dL (ref 31.5–36.0)
MCV: 92.3 fL (ref 79.5–101.0)
MONO#: 0.4 10*3/uL (ref 0.1–0.9)
MONO%: 8 % (ref 0.0–14.0)
NEUT#: 3.4 10*3/uL (ref 1.5–6.5)
NEUT%: 64.3 % (ref 38.4–76.8)
Platelets: 256 10*3/uL (ref 145–400)
RBC: 4.19 10*6/uL (ref 3.70–5.45)
RDW: 13.9 % (ref 11.2–14.5)
WBC: 5.2 10*3/uL (ref 3.9–10.3)

## 2016-06-02 LAB — COMPREHENSIVE METABOLIC PANEL
ALBUMIN: 3.6 g/dL (ref 3.5–5.0)
ALK PHOS: 83 U/L (ref 40–150)
ALT: 23 U/L (ref 0–55)
AST: 16 U/L (ref 5–34)
Anion Gap: 9 mEq/L (ref 3–11)
BILIRUBIN TOTAL: 0.58 mg/dL (ref 0.20–1.20)
BUN: 19.2 mg/dL (ref 7.0–26.0)
CALCIUM: 9.2 mg/dL (ref 8.4–10.4)
CO2: 27 mEq/L (ref 22–29)
CREATININE: 0.8 mg/dL (ref 0.6–1.1)
Chloride: 108 mEq/L (ref 98–109)
EGFR: 75 mL/min/{1.73_m2} — ABNORMAL LOW (ref >90)
Glucose: 106 mg/dl (ref 70–140)
POTASSIUM: 4.2 meq/L (ref 3.5–5.1)
Sodium: 144 mEq/L (ref 136–145)
Total Protein: 7 g/dL (ref 6.4–8.3)

## 2016-06-02 MED ORDER — TAMOXIFEN CITRATE 20 MG PO TABS: 20.0000 mg | ORAL_TABLET | Freq: Every day | ORAL | 2 refills | Status: DC

## 2016-06-03 ENCOUNTER — Encounter: Payer: Self-pay | Admitting: Hematology

## 2016-06-07 ENCOUNTER — Telehealth: Payer: Self-pay | Admitting: Hematology

## 2016-06-07 NOTE — Telephone Encounter (Signed)
Patient bypassed scheduling on 06/02/16.  Called patient to confirm follow up appointment. Mobile number is not in service. Called Home number and was told by a gentleman, that the number/phone does not belong to the patient. Appointment letter and schedule mailed.

## 2016-09-01 ENCOUNTER — Ambulatory Visit: Payer: 59 | Admitting: Hematology

## 2016-09-01 ENCOUNTER — Other Ambulatory Visit: Payer: 59

## 2016-09-13 NOTE — Progress Notes (Addendum)
[place]  [phone]) [phone] [phone]  Clinic Follow Up Note   Patient Care Team: Harlan Stains, MD as PCP - General (Family Medicine) Kyung Rudd, MD as Consulting Physician (Radiation Oncology) Rolm Bookbinder, MD as Consulting Physician (General Surgery) Delice Bison, Charlestine Massed, NP as Nurse Practitioner (Hematology and Oncology) Truitt Merle, MD as Consulting Physician (Hematology) 09/15/2016  CHIEF COMPLAINTS:  Follow up left breast cancer   Oncology History   Cancer Staging Malignant neoplasm of upper-outer quadrant of left female breast [facility]) Staging form: Breast, AJCC 7th Edition - Clinical stage from 12/15/2015: Stage Unknown ([place], N0, M0) - Unsigned Staging form: Breast, AJCC 8th Edition - Pathologic stage from 03/05/2016: Stage IA (pT1c, pN0, cM0, G1, ER: Positive, PR: Positive, HER2: Negative, Oncotype DX score: 8) - Signed by Truitt Merle, MD on 03/05/2016       Malignant neoplasm of upper-outer quadrant of left female breast ([place])   11/30/2015 Mammogram    Diagnostic mammogram of left breast showed persistent left breast architectural distortion in the upper outer quadrant, no responding findings on ultrasound, left axilla was negative on ultrasound.      12/01/2015 Initial Diagnosis    Malignant neoplasm of upper-outer quadrant of left female breast ([place])      12/01/2015 Initial Biopsy    Left breast core needle biopsy in the upper outer quadrant showed invasive ductal carcinoma, G1, and atypical ductal hyperplasia.      12/01/2015 Receptors her2    ER 90% positive, PR 100% positive, strong staining, HER-2 negative, Ki-67 5%       12/21/2015 Surgery    Left breast lumpectomy and sentinel lymph node biopsy.      12/21/2015 Pathology Results    Left breast lumpectomy showed invasive ductal carcinoma with calcifications, grade 1, 1.4 cm, low-grade DCIS, surgical margins were negative (greater than 0.2cm). 2 sentinel lymph nodes  were negative. No lymphovascular invasion.      12/21/2015 Oncotype testing    Recurrence score 8, which predicts 10 year risk of distant recurrence 6% with tamoxifen.      01/31/2016 - 02/25/2016 Radiation Therapy    1. The Left breast was treated to 42.5 Gy in 17 fractions at 2.5 Gy per fraction. 2. The Left breast was boosted to 7.5 in 3 fractions at 2.5 Gy per fraction.      03/2016 -  Anti-estrogen oral therapy    adjuvant letrozole 2.29m daily, changed to Tamoxifen due to arthralgia in June 2018       HISTORY OF PRESENTING ILLNESS:  DSamaa Ueda61y.o. female is here because of her recently diagnosed left breast cancer. She is accompanied by her husband to our multidisciplinary breast clinic today.  This was discovered by screening mammogram on 11/25/2015, which showed architectural distortion in the left breast. Her previous last mammogram was done in June 2013 and it was normal. She had no palpable breast mass or lymphadenopathy, denies any constitutional symptoms. She underwent diagnostic mammogram which showed a persistent architectural distortion in the upper outer quadrant of left breast, but responding findings on ultrasound. She underwent left breast core needle biopsy which showed invasive ductal carcinoma, grade 1, ER/PR stripe positive, HER-2 negative.  She feels well, denies any significant pain, or other symptoms. She has no significant past medical history, she did have hysterectomy and BSO about 15-20 years ago for fibroids. 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  GYN HISTORY  Menarchal: 89 LMP: 43 (hysterectomy and BSO)  Contraceptive:no  HRT: yes, 15 years, stopped in 11/2015 G1P0: She had a 1 topical pregnancy. She has 2 adopted children.  CURRENT THERAPY: Letrozole started 03/2016, switched to Tamoxifen due to Arthralgia, started in 06/2016  INTERIM HISTORY:    Ms. Pringle returns today for follow up as scheduled.  She reports her joint aches are much better on tamoxifen, which she started in June 2018. She is having trouble sleeping, which was present before treatment but slightly worse on tamoxifen. She falls asleep early, around 9:30 PM, and we'll wake up around 2:30 AM. Her friends recommended supplements, which she has not taken yet. Some contain soy products, and she has questions about this. Her sleep disturbance leads to daytime fatigue. She also notes "brain fog" with occasional word forgetfulness. At times she feels anxious, especially at work, feeling like she is "shaking inside." She denies depressed mood. Otherwise she feels well with good appetite. Her hot flashes are manageable and denies night sweats. She denies cough, chest pain, shortness of breath, nausea, vomiting, constipation, or diarrhea. She denies vaginal bleeding or spotting. At times she feels urinary urgency. She denies pain or urinary frequency.  MEDICAL HISTORY:  Past Medical History:  Diagnosis Date  . Allergy   . Breast cancer ([place]) 12/01/2015   Left Breast  . GERD (gastroesophageal reflux disease)   . 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 History of kidney stones   . Malignant neoplasm of upper-outer quadrant of left female breast ([place]) 12/07/2015   SURGICAL HISTORY: Past Surgical History:  Procedure Laterality Date  . ABDOMINAL HYSTERECTOMY    . BREAST LUMPECTOMY WITH RADIOACTIVE SEED AND SENTINEL LYMPH NODE BIOPSY Left 12/21/2015   Procedure: LEFT BREAST LUMPECTOMY WITH RADIOACTIVE SEED AND SENTINEL LYMPH NODE BIOPSY;  Surgeon: Rolm Bookbinder, MD;  Location: [place];  Service: General;  Laterality: Left;  . BREAST SURGERY     breast lumpectomy ?2000   . COLONOSCOPY    . PILONIDAL CYST EXCISION     SOCIAL HISTORY: Social History   Social History  . Marital status: Married    Spouse name: N/A  . Number of children: N/A  . Years of education: N/A    Occupational History  . Not on file.   Social History Main Topics  . Smoking status: Never Smoker  . Smokeless tobacco: Never Used  . Alcohol use 1.2 oz/week    2 Glasses of wine per week  . Drug use: No  . Sexual activity: Yes   Other Topics Concern  . Not on file   Social History Narrative  . No narrative on file   FAMILY HISTORY: Family History  Problem Relation Age of Onset  . Lung cancer Mother   . Cancer Mother        lung cancer  . Cancer Father        GI cancer   . Cancer Cousin 70       breast cancer    ALLERGIES:  is allergic to penicillins.  MEDICATIONS:  Current Outpatient Prescriptions  Medication Sig Dispense Refill  . tamoxifen (NOLVADEX) 20 MG tablet Take 1 tablet (20 mg total) by mouth daily. 90 tablet 3  . Vitamin D, Ergocalciferol, (DRISDOL) 50000 units CAPS capsule Take 50,000 Units by mouth every Monday.     . diphenhydramine-acetaminophen (TYLENOL PM) 25-500 MG TABS tablet Take 2 tablets by mouth at bedtime as  needed (for sleep.).    Marland Kitchen Multiple Vitamins-Minerals (CENTRUM ADULTS PO) Take by mouth.     No current facility-administered medications for this visit.    REVIEW OF SYSTEMS:    Constitutional: Denies fevers, chills or abnormal night sweats (+) fatigue (+) trouble sleeping (+) hot flashes, tolerable Eyes: Denies blurriness of vision, double vision or watery eyes Currently Ears, nose, mouth, throat, and face: Denies mucositis or sore throat Respiratory: Denies cough, dyspnea or wheezes Cardiovascular: Denies palpitation, chest discomfort or lower extremity swelling Gastrointestinal:  Denies nausea, vomiting, constipation, diarrhea, heartburn or change in bowel habits GU: (+) Urinary urgency. Denies urinary frequency or pain Skin: Denies abnormal skin rashes  Lymphatics: Denies new lymphadenopathy or easy bruising Neurological:Denies numbness, tingling or new weaknesses Behavioral/Psych: Mood is stable, no new changes (+)  anxiety Musculoskeletal: (+) joint pains, bilateral knees Breasts: performs self exams, denies breast changes or pain All other systems were reviewed with the patient and are negative.  PHYSICAL EXAMINATION:  ECOG PERFORMANCE STATUS: 1 - Symptomatic but completely ambulatory  Vitals:   09/15/16 0857  BP: 122/73  Pulse: 78  Resp: 20  Temp: 98.7 F (37.1 C)  SpO2: 100%   Filed Weights   09/15/16 0857  Weight: 179 lb (81.2 kg)    GENERAL:alert, no distress and comfortable. Stable weight. SKIN: skin color, texture, turgor are normal, no rashes or significant lesions EYES: normal, conjunctiva are pink and non-injected, sclera clear OROPHARYNX:no exudate, no erythema and lips, buccal mucosa, and tongue normal  NECK: supple, thyroid normal size, non-tender, without nodularity LYMPH:  no palpable cervical, supraclavicular or axillary lymphadenopathy  LUNGS: clear to auscultation bilaterally, normal breathing effort HEART: regular rate & rhythm, no murmurs, no upper or lower extremity edema ABDOMEN:abdomen soft, non-tender, normal bowel sounds, no palpable hepatosplenomegaly or masses. No suprapubic tenderness. Musculoskeletal:no cyanosis of digits and no clubbing  PSYCH: alert & oriented x 3 with fluent speech NEURO: no focal motor/sensory deficits Breasts: Breast exam deferred today. Patient performs self exam and denies pain, changes, or concerns.   LABORATORY DATA:  I have reviewed the data as listed CBC Latest Ref Rng & Units 09/15/2016 06/02/2016 12/15/2015  WBC 3.9 - 10.3 10e3/uL 6.0 5.2 4.9  Hemoglobin 11.6 - 15.9 g/dL 12.6 13.3 13.4  Hematocrit 34.8 - 46.6 % 38.0 38.7 40.4  Platelets 145 - 400 10e3/uL 252 256 302   CMP Latest Ref Rng & Units 09/15/2016 06/02/2016 12/15/2015  Glucose 70 - 140 mg/dl 105 106 99  BUN 7.0 - 26.0 mg/dL 23.4 19.2 13.7  Creatinine 0.6 - 1.1 mg/dL 0.9 0.8 0.8  Sodium 136 - 145 mEq/L 140 144 141  Potassium 3.5 - 5.1 mEq/L 4.3 4.2 4.0  Chloride 101 -  111 mmol/L - - -  CO2 22 - 29 mEq/L 26 27 27   Calcium 8.4 - 10.4 mg/dL 9.3 9.2 9.1  Total Protein 6.4 - 8.3 g/dL 7.2 7.0 7.1  Total Bilirubin 0.20 - 1.20 mg/dL 0.86 0.58 0.67  Alkaline Phos 40 - 150 U/L 70 83 70  AST 5 - 34 U/L 13 16 11   ALT 0 - 55 U/L 17 23 14    PATHOLOGY REPORT  Oncotype 12/21/2015 Diagnosis 12/21/2015 1. Breast, lumpectomy, Left - INVASIVE DUCTAL CARCINOMA WITH CALCIFICATIONS, GRADE I/III, SPANNING 1.4 CM. - DUCTAL CARCINOMA IN SITU WITH CALCIFICATIONS, LOW GRADE. - THE SURGICAL RESECTION MARGINS ARE NEGATIVE FOR CARCINOMA. - SEE ONCOLOGY TABLE BELOW. 2. Lymph node, sentinel, biopsy, Left axillary - THERE IS NO EVIDENCE OF CARCINOMA  IN 1 OF 1 LYMPH NODE (0/1). 3. Lymph node, sentinel, biopsy, Left axillary - THERE IS NO EVIDENCE OF CARCINOMA IN 1 OF 1 LYMPH NODE (0/1). 4. Breast, excision, Left superior margin - BENIGN ADIPOSE TISSUE. - THERE IS NO EVIDENCE OF MALIGNANCY. - SEE COMMENT., 5. Breast, excision, Left inferior margin - FIBROCYSTIC CHANGES - THERE IS NO EVIDENCE OF MALIGNANCY. - SEE COMMENT. 6. Breast, excision, Left medial margin - FIBROCYSTIC CHANGES - THERE IS NO EVIDENCE OF MALIGNANCY. - SEE COMMENT. Microscopic Comment 1. BREAST, INVASIVE TUMOR, WITH LYMPH NODES PRESENT Specimen, including laterality and lymph node sampling (sentinel, non-sentinel): Left breast and left axillary lymph nodes Procedure: Seed localized lumpectomy, additional margin resections and axillary lymph node resections. Histologic type: Ductal Grade: I Tubule formation: 1  Nuclear pleomorphism: 2 Mitotic: 1 Tumor size (gross measurement: 1.4 cm Margins: Greater than 0.2 cm to all margins. Lymphovascular invasion: Not identified Ductal carcinoma in situ: Present Grade: Low grade Extensive intraductal component: No Lobular neoplasia: Not identified Tumor focality: Unifocal Extent of tumor: Confined to breast parenchyma. Lymph nodes: Examined: 2 Sentinel 0  Non-sentinel 2 Total Lymph nodes with metastasis: 0 Breast prognostic profile: Case [phone] Estrogen receptor: 90%, strong Progesterone receptor: 100%, strong Her 2 neu: No amplification was detected. The ratio was 1.05 Ki-67: 5% Non-neoplastic breast: Fibrocystic changes. TNM: pT1c, pN0 (JBK:kh 12-23-15)  Diagnosis 12/01/2015 Breast, left, needle core biopsy, upper outer - INVASIVE DUCTAL CARCINOMA. - ATYPICAL DUCTAL HYPERPLASIA. - SEE COMMENT. Microscopic Comment The carcinoma appears grade 1. A breast prognostic profile will be performed and the results reported separately. The results were called to the [place] on 12/03/2015. (JBK:k 12/03/15) Results: IMMUNOHISTOCHEMICAL AND MORPHOMETRIC ANALYSIS PERFORMED MANUALLY Estrogen Receptor: 90%, POSITIVE, STRONG STAINING INTENSITY Progesterone Receptor: 100%, POSITIVE, STRONG STAINING INTENSITY Proliferation Marker Ki67: 5%  Results: HER2 - NEGATIVE RATIO OF HER2/CEP17 SIGNALS 1.05 AVERAGE HER2 COPY NUMBER PER CELL 1.95  RADIOGRAPHIC STUDIES: I have personally reviewed the radiological images as listed and agreed with the findings in the report.  DEXA 04/11/2016 The BMD measured at Femur Neck Right is 0.914 g/cm2 with a T-score of -0.9. This patient is considered normal according to [facility]) criteria.   US Breast LTD L breast 12/21/2015 IMPRESSION: 5 cm normal appearing intramammary lymph node in the 2 o'clock position of the left breast, corresponding to the mass seen on the recent MR.  MRI Breast b/l 12/18/2015 IMPRESSION: 1. The known malignancy in the upper-outer left breast measures up to 1.4 cm by MRI.  2. There is an indeterminate 0.5 cm oval mass that lies approximately 2 cm posterior to the known malignancy. The imaging characteristics suggest this may represent a lymph node, however further investigation is warranted.  3.  No MRI evidence of malignancy in  the right breast.  [place] L breast 11/30/2015 IMPRESSION: Persistent left breast architectural distortion.  Diagnostic Mammogram L breast 11/30/2015 IMPRESSION: Persistent left breast architectural distortion.  Screening Mammogram b/l 11/23/2015 IMPRESSION: Further evaluation is suggested for possible mass in the left breast.  ASSESSMENT & PLAN:  61 y.o. Caucasian female, without significant past medical history, diagnosed upon screening mammogram in November 2017. Status post left breast lumpectomy and sentinel lymph node biopsy on 12/21/2015. Completed adjuvant radiation therapy from 01/31/2016 to 02/25/2016 to the left breast. She began antiestrogen therapy with letrozole in March 2018. This was discontinued due to arthralgias and switched to tamoxifen beginning June 2018.  1. Malignant neoplasm of upper-outer quadrant of left breast, invasive  ductal carcinoma, grade 1, pT1cN0M0, stage IA, ER+/PR+/HER2- -MD previously reviewed her surgical pathology findings with patient -She had low-grade early stage breast cancer, surgical margins were negative, 2 sentinel lymph nodes were negative. -I reviewed the Oncotype result with the patient. She has low risk disease with recurrence score of 8; I did not recommend chemotherapy.  -She has low risk disease. Her prognosis is excellent. -She has completed radiation with Dr. Lisbeth Renshaw on 02/25/16.  --Started adjuvant Letrozole 03/2016, discontinued 05/2016 due to significant arthralgia. -After previous discussion with Dr. Burr Medico about alternatives such as exemestane and tamoxifen, the patient opted for tamoxifen which she began 06/2016 She has had hysterectomy, no risk of endometrial cancer. -She is doing well, lab reviewed and CBC and CMP are within normal limits, exam declined today, no clinical concern for recurrence. We'll continue surveillance with a breast exam at her next visit. -Will schedule next mammogram 12/2016 - f/u in 4 months  2. Bone  Health -recent bone density scan from 05/08/2016 was normal with T score of -0.9 at right femur neck.  -She continues calcium and vitamin D  3. Arthralgia -She experienced significant arthralgias on Letrozole, specifically in her knees -Arthralgias much improved on tamoxifen   4. Trouble sleeping/Anxiety -Likely secondary to tamoxifen -I discussed with her to avoid supplements that contain soy products as these contain estrogen -I discussed that Tamoxifen may effect her mood which can cause changes sleep pattern -She declines social work referral today, she is not interested in taking medication or speaking with her therapist at this time -I suggest melatonin and/or benadryl to help her sleep  5. Urinary urgency -Likely related to tamoxifen and vaginal atrophy -I discussed performing Kegel exercises several times throughout the day. -If this becomes significant problem and she has incontinence, I may refer her for pelvic PT  Plan -Continue Tamoxifen, I refilled this for her today with a 90-day supply per her request -Melatonin and/or Benadryl for sleep -Kegel exercises for urinary urgency -f/u in 4 months  -Mammogram at [facility] in 12/2016  All questions were answered. The patient knows to call the clinic with any problems, questions or concerns.  I spent 25 minutes counseling the patient face to face. The total time spent in the appointment was 30 minutes and more than 50% was on counseling.  Cira Rue, AGNP-C 09/15/2016  Attending addendum I have seen the patient, examined her. I agree with the assessment and and plan and have edited the notes.     Truitt Merle, MD 09/15/2016

## 2016-09-15 ENCOUNTER — Other Ambulatory Visit (HOSPITAL_BASED_OUTPATIENT_CLINIC_OR_DEPARTMENT_OTHER): Payer: 59

## 2016-09-15 ENCOUNTER — Encounter: Payer: Self-pay | Admitting: Hematology

## 2016-09-15 ENCOUNTER — Ambulatory Visit (HOSPITAL_BASED_OUTPATIENT_CLINIC_OR_DEPARTMENT_OTHER): Payer: 59 | Admitting: Hematology

## 2016-09-15 VITALS — BP 122/73 | HR 78 | Temp 98.7°F | Resp 20 | Ht 66.0 in | Wt 179.0 lb

## 2016-09-15 DIAGNOSIS — C50412 Malignant neoplasm of upper-outer quadrant of left female breast: Secondary | ICD-10-CM | POA: Diagnosis not present

## 2016-09-15 DIAGNOSIS — M25561 Pain in right knee: Secondary | ICD-10-CM

## 2016-09-15 DIAGNOSIS — Z170 Estrogen receptor positive status [ER+]: Secondary | ICD-10-CM

## 2016-09-15 DIAGNOSIS — F419 Anxiety disorder, unspecified: Secondary | ICD-10-CM | POA: Diagnosis not present

## 2016-09-15 DIAGNOSIS — M25562 Pain in left knee: Secondary | ICD-10-CM | POA: Diagnosis not present

## 2016-09-15 DIAGNOSIS — R3915 Urgency of urination: Secondary | ICD-10-CM | POA: Diagnosis not present

## 2016-09-15 DIAGNOSIS — G4700 Insomnia, unspecified: Secondary | ICD-10-CM | POA: Diagnosis not present

## 2016-09-15 LAB — CBC WITH DIFFERENTIAL/PLATELET
BASO%: 0.2 % (ref 0.0–2.0)
BASOS ABS: 0 10*3/uL (ref 0.0–0.1)
EOS%: 1.3 % (ref 0.0–7.0)
Eosinophils Absolute: 0.1 10*3/uL (ref 0.0–0.5)
HEMATOCRIT: 38 % (ref 34.8–46.6)
HGB: 12.6 g/dL (ref 11.6–15.9)
LYMPH#: 1.5 10*3/uL (ref 0.9–3.3)
LYMPH%: 25.5 % (ref 14.0–49.7)
MCH: 31 pg (ref 25.1–34.0)
MCHC: 33.2 g/dL (ref 31.5–36.0)
MCV: 93.4 fL (ref 79.5–101.0)
MONO#: 0.4 10*3/uL (ref 0.1–0.9)
MONO%: 6.4 % (ref 0.0–14.0)
NEUT#: 4 10*3/uL (ref 1.5–6.5)
NEUT%: 66.6 % (ref 38.4–76.8)
PLATELETS: 252 10*3/uL (ref 145–400)
RBC: 4.07 10*6/uL (ref 3.70–5.45)
RDW: 13.9 % (ref 11.2–14.5)
WBC: 6 10*3/uL (ref 3.9–10.3)

## 2016-09-15 LAB — COMPREHENSIVE METABOLIC PANEL
ALT: 17 U/L (ref 0–55)
ANION GAP: 8 meq/L (ref 3–11)
AST: 13 U/L (ref 5–34)
Albumin: 3.5 g/dL (ref 3.5–5.0)
Alkaline Phosphatase: 70 U/L (ref 40–150)
BUN: 23.4 mg/dL (ref 7.0–26.0)
CALCIUM: 9.3 mg/dL (ref 8.4–10.4)
CHLORIDE: 106 meq/L (ref 98–109)
CO2: 26 mEq/L (ref 22–29)
Creatinine: 0.9 mg/dL (ref 0.6–1.1)
EGFR: 72 mL/min/{1.73_m2} — ABNORMAL LOW (ref >90)
Glucose: 105 mg/dl (ref 70–140)
POTASSIUM: 4.3 meq/L (ref 3.5–5.1)
Sodium: 140 mEq/L (ref 136–145)
Total Bilirubin: 0.86 mg/dL (ref 0.20–1.20)
Total Protein: 7.2 g/dL (ref 6.4–8.3)

## 2016-09-15 MED ORDER — TAMOXIFEN CITRATE 20 MG PO TABS: 20.0000 mg | ORAL_TABLET | Freq: Every day | ORAL | 3 refills | Status: DC

## 2016-10-14 ENCOUNTER — Other Ambulatory Visit: Payer: Self-pay | Admitting: Hematology

## 2016-10-26 ENCOUNTER — Other Ambulatory Visit: Payer: Self-pay | Admitting: *Deleted

## 2016-12-20 ENCOUNTER — Other Ambulatory Visit: Payer: Self-pay | Admitting: Family Medicine

## 2016-12-20 ENCOUNTER — Ambulatory Visit
Admission: RE | Admit: 2016-12-20 | Discharge: 2016-12-20 | Disposition: A | Discharge status: Home / Self Care | Payer: 59 | Source: Ambulatory Visit | Attending: Family Medicine | Admitting: Family Medicine

## 2016-12-20 DIAGNOSIS — R0789 Other chest pain: Secondary | ICD-10-CM

## 2016-12-22 ENCOUNTER — Ambulatory Visit
Admission: RE | Admit: 2016-12-22 | Discharge: 2016-12-22 | Disposition: A | Discharge status: Home / Self Care | Payer: 59 | Source: Ambulatory Visit | Attending: Nurse Practitioner | Admitting: Nurse Practitioner

## 2016-12-22 DIAGNOSIS — C50412 Malignant neoplasm of upper-outer quadrant of left female breast: Secondary | ICD-10-CM

## 2016-12-22 DIAGNOSIS — Z170 Estrogen receptor positive status [ER+]: Principal | ICD-10-CM

## 2016-12-22 HISTORY — DX: Personal history of irradiation: Z92.3

## 2017-01-16 ENCOUNTER — Ambulatory Visit (INDEPENDENT_AMBULATORY_CARE_PROVIDER_SITE_OTHER): Payer: BLUE CROSS/BLUE SHIELD | Admitting: Psychology

## 2017-01-16 DIAGNOSIS — F4321 Adjustment disorder with depressed mood: Secondary | ICD-10-CM

## 2017-02-07 ENCOUNTER — Ambulatory Visit (INDEPENDENT_AMBULATORY_CARE_PROVIDER_SITE_OTHER): Payer: BLUE CROSS/BLUE SHIELD | Admitting: Psychology

## 2017-02-07 DIAGNOSIS — F4321 Adjustment disorder with depressed mood: Secondary | ICD-10-CM

## 2017-02-23 ENCOUNTER — Ambulatory Visit: Payer: BLUE CROSS/BLUE SHIELD | Admitting: Psychology

## 2017-09-01 ENCOUNTER — Other Ambulatory Visit: Payer: Self-pay | Admitting: Nurse Practitioner

## 2017-09-01 DIAGNOSIS — Z170 Estrogen receptor positive status [ER+]: Principal | ICD-10-CM

## 2017-09-01 DIAGNOSIS — C50412 Malignant neoplasm of upper-outer quadrant of left female breast: Secondary | ICD-10-CM

## 2017-09-05 ENCOUNTER — Telehealth: Payer: Self-pay | Admitting: Hematology

## 2017-09-05 NOTE — Telephone Encounter (Signed)
Tried to call patient - not available - no vmail full . Sent letter in the mail with appt date and time.

## 2017-09-18 ENCOUNTER — Inpatient Hospital Stay: Payer: Self-pay | Attending: Hematology | Admitting: Hematology

## 2017-09-18 ENCOUNTER — Inpatient Hospital Stay: Payer: Self-pay

## 2017-11-07 ENCOUNTER — Other Ambulatory Visit: Payer: Self-pay | Admitting: Hematology

## 2017-11-07 ENCOUNTER — Telehealth: Payer: Self-pay | Admitting: Hematology

## 2017-11-07 DIAGNOSIS — Z853 Personal history of malignant neoplasm of breast: Secondary | ICD-10-CM

## 2017-11-07 NOTE — Telephone Encounter (Signed)
Patient called to reschedule  °

## 2017-12-25 ENCOUNTER — Ambulatory Visit
Admission: RE | Admit: 2017-12-25 | Discharge: 2017-12-25 | Disposition: A | Discharge status: Home / Self Care | Payer: Self-pay | Source: Ambulatory Visit | Attending: Hematology | Admitting: Hematology

## 2017-12-25 DIAGNOSIS — Z853 Personal history of malignant neoplasm of breast: Secondary | ICD-10-CM

## 2017-12-27 ENCOUNTER — Telehealth: Payer: Self-pay

## 2017-12-27 ENCOUNTER — Inpatient Hospital Stay: Payer: BLUE CROSS/BLUE SHIELD | Attending: Hematology

## 2017-12-27 ENCOUNTER — Inpatient Hospital Stay (HOSPITAL_BASED_OUTPATIENT_CLINIC_OR_DEPARTMENT_OTHER): Payer: BLUE CROSS/BLUE SHIELD | Admitting: Hematology

## 2017-12-27 ENCOUNTER — Encounter: Payer: Self-pay | Admitting: Hematology

## 2017-12-27 VITALS — BP 119/73 | HR 89 | Temp 98.3°F | Resp 17 | Ht 66.0 in | Wt 179.4 lb

## 2017-12-27 DIAGNOSIS — Z90710 Acquired absence of both cervix and uterus: Secondary | ICD-10-CM

## 2017-12-27 DIAGNOSIS — Z170 Estrogen receptor positive status [ER+]: Secondary | ICD-10-CM | POA: Insufficient documentation

## 2017-12-27 DIAGNOSIS — C50412 Malignant neoplasm of upper-outer quadrant of left female breast: Secondary | ICD-10-CM | POA: Insufficient documentation

## 2017-12-27 DIAGNOSIS — Z923 Personal history of irradiation: Secondary | ICD-10-CM

## 2017-12-27 DIAGNOSIS — Z79810 Long term (current) use of selective estrogen receptor modulators (SERMs): Secondary | ICD-10-CM | POA: Insufficient documentation

## 2017-12-27 DIAGNOSIS — E2839 Other primary ovarian failure: Secondary | ICD-10-CM

## 2017-12-27 DIAGNOSIS — Z79899 Other long term (current) drug therapy: Secondary | ICD-10-CM

## 2017-12-27 LAB — COMPREHENSIVE METABOLIC PANEL
ALBUMIN: 3.6 g/dL (ref 3.5–5.0)
ALT: 14 U/L (ref 0–44)
ANION GAP: 9 (ref 5–15)
AST: 10 U/L — AB (ref 15–41)
Alkaline Phosphatase: 59 U/L (ref 38–126)
BUN: 15 mg/dL (ref 8–23)
CHLORIDE: 109 mmol/L (ref 98–111)
CO2: 26 mmol/L (ref 22–32)
Calcium: 9 mg/dL (ref 8.9–10.3)
Creatinine, Ser: 0.78 mg/dL (ref 0.44–1.00)
GFR calc Af Amer: >60 mL/min (ref >60)
Glucose, Bld: 97 mg/dL (ref 70–99)
POTASSIUM: 3.7 mmol/L (ref 3.5–5.1)
Sodium: 144 mmol/L (ref 135–145)
TOTAL PROTEIN: 7.2 g/dL (ref 6.5–8.1)
Total Bilirubin: 0.4 mg/dL (ref 0.3–1.2)

## 2017-12-27 LAB — CBC WITH DIFFERENTIAL/PLATELET
Abs Immature Granulocytes: 0.02 10*3/uL (ref 0.00–0.07)
BASOS PCT: 0 %
Basophils Absolute: 0 10*3/uL (ref 0.0–0.1)
Eosinophils Absolute: 0.1 10*3/uL (ref 0.0–0.5)
Eosinophils Relative: 1 %
HCT: 39.4 % (ref 36.0–46.0)
HEMOGLOBIN: 13 g/dL (ref 12.0–15.0)
IMMATURE GRANULOCYTES: 0 %
LYMPHS PCT: 21 %
Lymphs Abs: 1.5 10*3/uL (ref 0.7–4.0)
MCH: 32.1 pg (ref 26.0–34.0)
MCHC: 33 g/dL (ref 30.0–36.0)
MCV: 97.3 fL (ref 80.0–100.0)
Monocytes Absolute: 0.5 10*3/uL (ref 0.1–1.0)
Monocytes Relative: 7 %
NEUTROS ABS: 5.1 10*3/uL (ref 1.7–7.7)
NEUTROS PCT: 71 %
Platelets: 245 10*3/uL (ref 150–400)
RBC: 4.05 MIL/uL (ref 3.87–5.11)
RDW: 13.4 % (ref 11.5–15.5)
WBC: 7.2 10*3/uL (ref 4.0–10.5)
nRBC: 0 % (ref 0.0–0.2)

## 2017-12-27 MED ORDER — TAMOXIFEN CITRATE 20 MG PO TABS: 20.0000 mg | ORAL_TABLET | Freq: Every day | ORAL | 1 refills | Status: DC

## 2017-12-27 NOTE — Telephone Encounter (Signed)
Printed avs and calender of upcoming appointment. Per 12/19 los 

## 2017-12-27 NOTE — Progress Notes (Signed)
[place]   Telephone:([phone] Fax:([phone]   Clinic Follow up Note   Patient Care Team: Harlan Stains, MD as PCP - General (Family Medicine) Kyung Rudd, MD as Consulting Physician (Radiation Oncology) Rolm Bookbinder, MD as Consulting Physician (General Surgery) Delice Bison, Charlestine Massed, NP as Nurse Practitioner (Hematology and Oncology) Truitt Merle, MD as Consulting Physician (Hematology)  Date of Service:  12/27/2017  CHIEF COMPLAINT: F/u of left breast cancer   SUMMARY OF ONCOLOGIC HISTORY: Oncology History   Cancer Staging Malignant neoplasm of upper-outer quadrant of left female breast [facility]) Staging form: Breast, AJCC 7th Edition - Clinical stage from 12/15/2015: Stage Unknown ([place], N0, M0) - Unsigned Staging form: Breast, AJCC 8th Edition - Pathologic stage from 03/05/2016: Stage IA (pT1c, pN0, cM0, G1, ER: Positive, PR: Positive, HER2: Negative, Oncotype DX score: 8) - Signed by Truitt Merle, MD on 03/05/2016       Malignant neoplasm of upper-outer quadrant of left female breast ([place])   11/30/2015 Mammogram    Diagnostic mammogram of left breast showed persistent left breast architectural distortion in the upper outer quadrant, no responding findings on ultrasound, left axilla was negative on ultrasound.    12/01/2015 Initial Diagnosis    Malignant neoplasm of upper-outer quadrant of left female breast ([place])    12/01/2015 Initial Biopsy    Left breast core needle biopsy in the upper outer quadrant showed invasive ductal carcinoma, G1, and atypical ductal hyperplasia.    12/01/2015 Receptors her2    ER 90% positive, PR 100% positive, strong staining, HER-2 negative, Ki-67 5%     12/21/2015 Surgery    Left breast lumpectomy and sentinel lymph node biopsy.    12/21/2015 Pathology Results    Left breast lumpectomy showed invasive ductal carcinoma with calcifications, grade 1, 1.4 cm, low-grade DCIS, surgical margins were negative (greater than  0.2cm). 2 sentinel lymph nodes were negative. No lymphovascular invasion.    12/21/2015 Oncotype testing    Recurrence score 8, which predicts 10 year risk of distant recurrence 6% with tamoxifen.    01/31/2016 - 02/25/2016 Radiation Therapy    1. The Left breast was treated to 42.5 Gy in 17 fractions at 2.5 Gy per fraction. 2. The Left breast was boosted to 7.5 in 3 fractions at 2.5 Gy per fraction.    03/2016 -  Anti-estrogen oral therapy    adjuvant letrozole 2.'5mg'$  daily, changed to Tamoxifen due to arthralgia in June 2018    04/11/2016 Imaging    04/11/2016 DEXA ASSESSMENT: The BMD measured at Femur Neck Right is 0.914 g/cm2 with a T-score of -0.9.     12/22/2016 Mammogram    12/22/2016 Mammogram IMPRESSION: 1. No mammographic evidence of malignancy involving either breast. 2. Expected post lumpectomy and post radiation changes involving left breast. 3. Expected post surgical changes related to the prior excisional biopsy involving the upper outer quadrant of the right breast.      CURRENT THERAPY:  Letrozole 2.5 mg started 03/2016, changed to Tamoxifen in 06/2016 due to arthralgia.   INTERVAL HISTORY:  Laurie Calhoun is here for a follow up of left breast cancer. She was last seen by me 7 months ago. In interim she was seen by NP Lacie 3 months ago.  She presents to the clinic today by herself. She notes she is doing very well. She notes she is tolerating Tamoxifen better than letrozole without arthralgia or hot flashes. She notes her breast feels better with some skin color change from radiation.  Her breast mammogram this month was normal.    REVIEW OF SYSTEMS:   Constitutional: Denies fevers, chills or abnormal weight loss Eyes: Denies blurriness of vision Ears, nose, mouth, throat, and face: Denies mucositis or sore throat Respiratory: Denies cough, dyspnea or wheezes Cardiovascular: Denies palpitation, chest discomfort or lower extremity swelling Gastrointestinal:   Denies nausea, heartburn or change in bowel habits Skin: Denies abnormal skin rashes Lymphatics: Denies new lymphadenopathy or easy bruising Neurological:Denies numbness, tingling or new weaknesses Behavioral/Psych: Mood is stable, no new changes  All other systems were reviewed with the patient and are negative.  MEDICAL HISTORY:  Past Medical History:  Diagnosis Date  . Allergy   . Breast cancer ([place]) 12/01/2015   Left Breast  . GERD (gastroesophageal reflux disease)   . 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 History of kidney stones   . Malignant neoplasm of upper-outer quadrant of left female breast ([place]) 12/07/2015  . Personal history of radiation therapy     SURGICAL HISTORY: Past Surgical History:  Procedure Laterality Date  . ABDOMINAL HYSTERECTOMY    . BREAST BIOPSY Left 12/01/2015  . BREAST LUMPECTOMY Left 12/21/2015   2017  . BREAST LUMPECTOMY WITH RADIOACTIVE SEED AND SENTINEL LYMPH NODE BIOPSY Left 12/21/2015   Procedure: LEFT BREAST LUMPECTOMY WITH RADIOACTIVE SEED AND SENTINEL LYMPH NODE BIOPSY;  Surgeon: Rolm Bookbinder, MD;  Location: [place];  Service: General;  Laterality: Left;  . BREAST SURGERY     breast lumpectomy ?2000   . COLONOSCOPY    . PILONIDAL CYST EXCISION      I have reviewed the social history and family history with the patient and they are unchanged from previous note.  ALLERGIES:  is allergic to penicillins.  MEDICATIONS:  Current Outpatient Medications  Medication Sig Dispense Refill  . APPLE CIDER VINEGAR PO Take by mouth.    . diphenhydramine-acetaminophen (TYLENOL PM) 25-500 MG TABS tablet Take 2 tablets by mouth at bedtime as needed (for sleep.).    Marland Kitchen Multiple Vitamins-Minerals (CENTRUM ADULTS PO) Take by mouth.    . tamoxifen (NOLVADEX) 20 MG tablet Take 1 tablet (20 mg total) by mouth daily. 90 tablet 1  . Vitamin D, Ergocalciferol, (DRISDOL) 50000 units CAPS capsule Take 50,000 Units by mouth every Monday.      No current  facility-administered medications for this visit.     PHYSICAL EXAMINATION: ECOG PERFORMANCE STATUS: 0 - Asymptomatic  Vitals:   12/27/17 1414  BP: 119/73  Pulse: 89  Resp: 17  Temp: 98.3 F (36.8 C)  SpO2: 100%   Filed Weights   12/27/17 1414  Weight: 179 lb 6.4 oz (81.4 kg)    GENERAL:alert, no distress and comfortable SKIN: skin color, texture, turgor are normal, no rashes or significant lesions EYES: normal, Conjunctiva are pink and non-injected, sclera clear OROPHARYNX:no exudate, no erythema and lips, buccal mucosa, and tongue normal  NECK: supple, thyroid normal size, non-tender, without nodularity LYMPH:  no palpable lymphadenopathy in the cervical, axillary or inguinal LUNGS: clear to auscultation and percussion with normal breathing effort HEART: regular rate & rhythm and no murmurs and no lower extremity edema ABDOMEN:abdomen soft, non-tender and normal bowel sounds Musculoskeletal:no cyanosis of digits and no clubbing  NEURO: alert & oriented x 3 with fluent speech, no focal motor/sensory deficits BREAST: S/p left breast lumpectomy: (+) surgical incision healed well, breast soft (+) mild skin hyperpigmentation post radiation  LABORATORY DATA:  I have reviewed the data as listed CBC Latest  Ref Rng & Units 12/27/2017 09/15/2016 06/02/2016  WBC 4.0 - 10.5 K/uL 7.2 6.0 5.2  Hemoglobin 12.0 - 15.0 g/dL 13.0 12.6 13.3  Hematocrit 36.0 - 46.0 % 39.4 38.0 38.7  Platelets 150 - 400 K/uL 245 252 256     CMP Latest Ref Rng & Units 12/27/2017 09/15/2016 06/02/2016  Glucose 70 - 99 mg/dL 97 105 106  BUN 8 - 23 mg/dL 15 23.4 19.2  Creatinine 0.44 - 1.00 mg/dL 0.78 0.9 0.8  Sodium 135 - 145 mmol/L 144 140 144  Potassium 3.5 - 5.1 mmol/L 3.7 4.3 4.2  Chloride 98 - 111 mmol/L 109 - -  CO2 22 - 32 mmol/L '26 26 27  '$ Calcium 8.9 - 10.3 mg/dL 9.0 9.3 9.2  Total Protein 6.5 - 8.1 g/dL 7.2 7.2 7.0  Total Bilirubin 0.3 - 1.2 mg/dL 0.4 0.86 0.58  Alkaline Phos 38 - 126 U/L 59 70 83    AST 15 - 41 U/L 10(L) 13 16  ALT 0 - 44 U/L '14 17 23      '$ RADIOGRAPHIC STUDIES: I have personally reviewed the radiological images as listed and agreed with the findings in the report. No results found.   ASSESSMENT & PLAN:  Laurie Calhoun is a 62 y.o. female with   1. Malignant neoplasm of upper-outer quadrant of left breast, invasive ductal carcinoma, grade 1, pT1cN0M0, ER+/PR+/HER2- -She was diagnosed in 11/2015. She is s/p left breast lumpectomy and adjuvant radiation. She started letrozole in 03/2016 but due to arthralgia we switched her to Tamoxifen in 06/2016. She is tolerating Tamoxifen very well.  -I discussed with Tamoxifen it is possible to gain weight and increased risk of blood clots. I encouraged her to maintain healthy diet, continue to exercise and watch her weight. If she plans to have surgery, I will hold Tamoxifen around that time.  -She has had a hysterectomy, no risk of endometrial cancer from tamoxifen. -She is clinically doing well. Lab reviewed, her CBC and CMP are within normal limits. Her physical exam and her 12/2017 mammogram were unremarkable. There is no clinical concern for recurrence. -continue Tamoxifen  -F/u in 6 months    2. Bone Health -05/08/2016 DEXA was normal with T score of -0.9 at right femur neck.  -Continue calcium and vitamin D -repeat DEX IN 2020  3. Arthralgia -Secondary to Letrozole -I switched her to Tamoxifen and her arthralgia resolved.   Plan -She is clinically doing well, no evidence of recurrence.  -Continue Tamoxifen  -Lab and f/u with lacie in 6 months  -DEXA scan before next visit    No problem-specific Assessment & Plan notes found for this encounter.   Orders Placed This Encounter  Procedures  . DG Bone Density    Standing Status:   Future    Standing Expiration Date:   12/27/2018    Order Specific Question:   Reason for Exam (SYMPTOM  OR DIAGNOSIS REQUIRED)    Answer:   screening    Order Specific  Question:   Preferred imaging location?    Answer:   [facility]   All questions were answered. The patient knows to call the clinic with any problems, questions or concerns. No barriers to learning was detected. I spent 15 minutes counseling the patient face to face. The total time spent in the appointment was 20 minutes and more than 50% was on counseling and review of test results     Truitt Merle, MD 12/27/2017   I, Joslyn Devon, am  acting as scribe for Truitt Merle, MD.   I have reviewed the above documentation for accuracy and completeness, and I agree with the above.

## 2018-04-02 IMAGING — DX DG CHEST 2V
2 series · 2 of 2 positions shown · non-contrast
Comparison: None.

CLINICAL DATA: Breast cancer. Chest tightness. Recent exposure to
pneumonia.

EXAM:
CHEST  2 VIEW

[dg chest 2 view (1 of 2)]
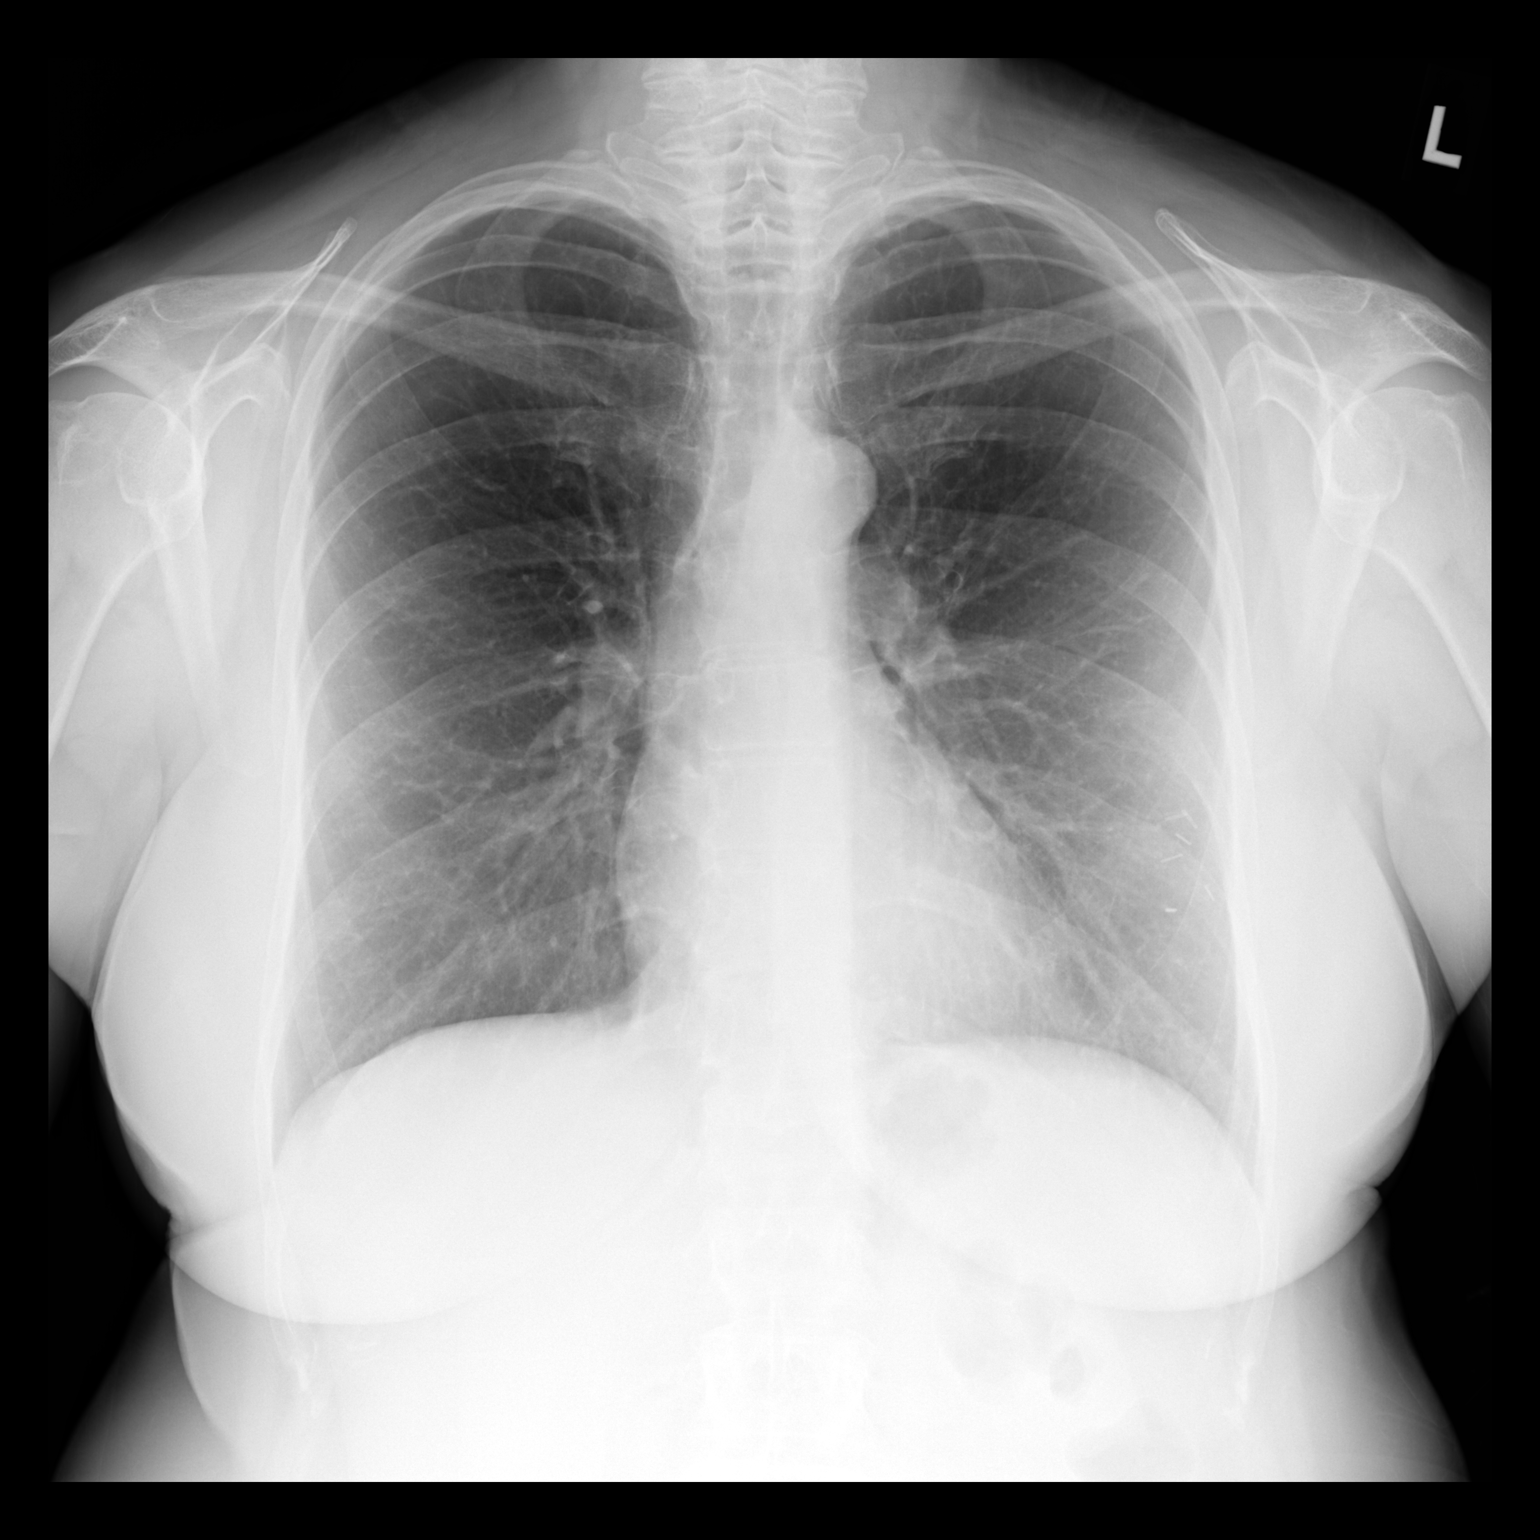

[dg chest 2 view (2 of 2)]
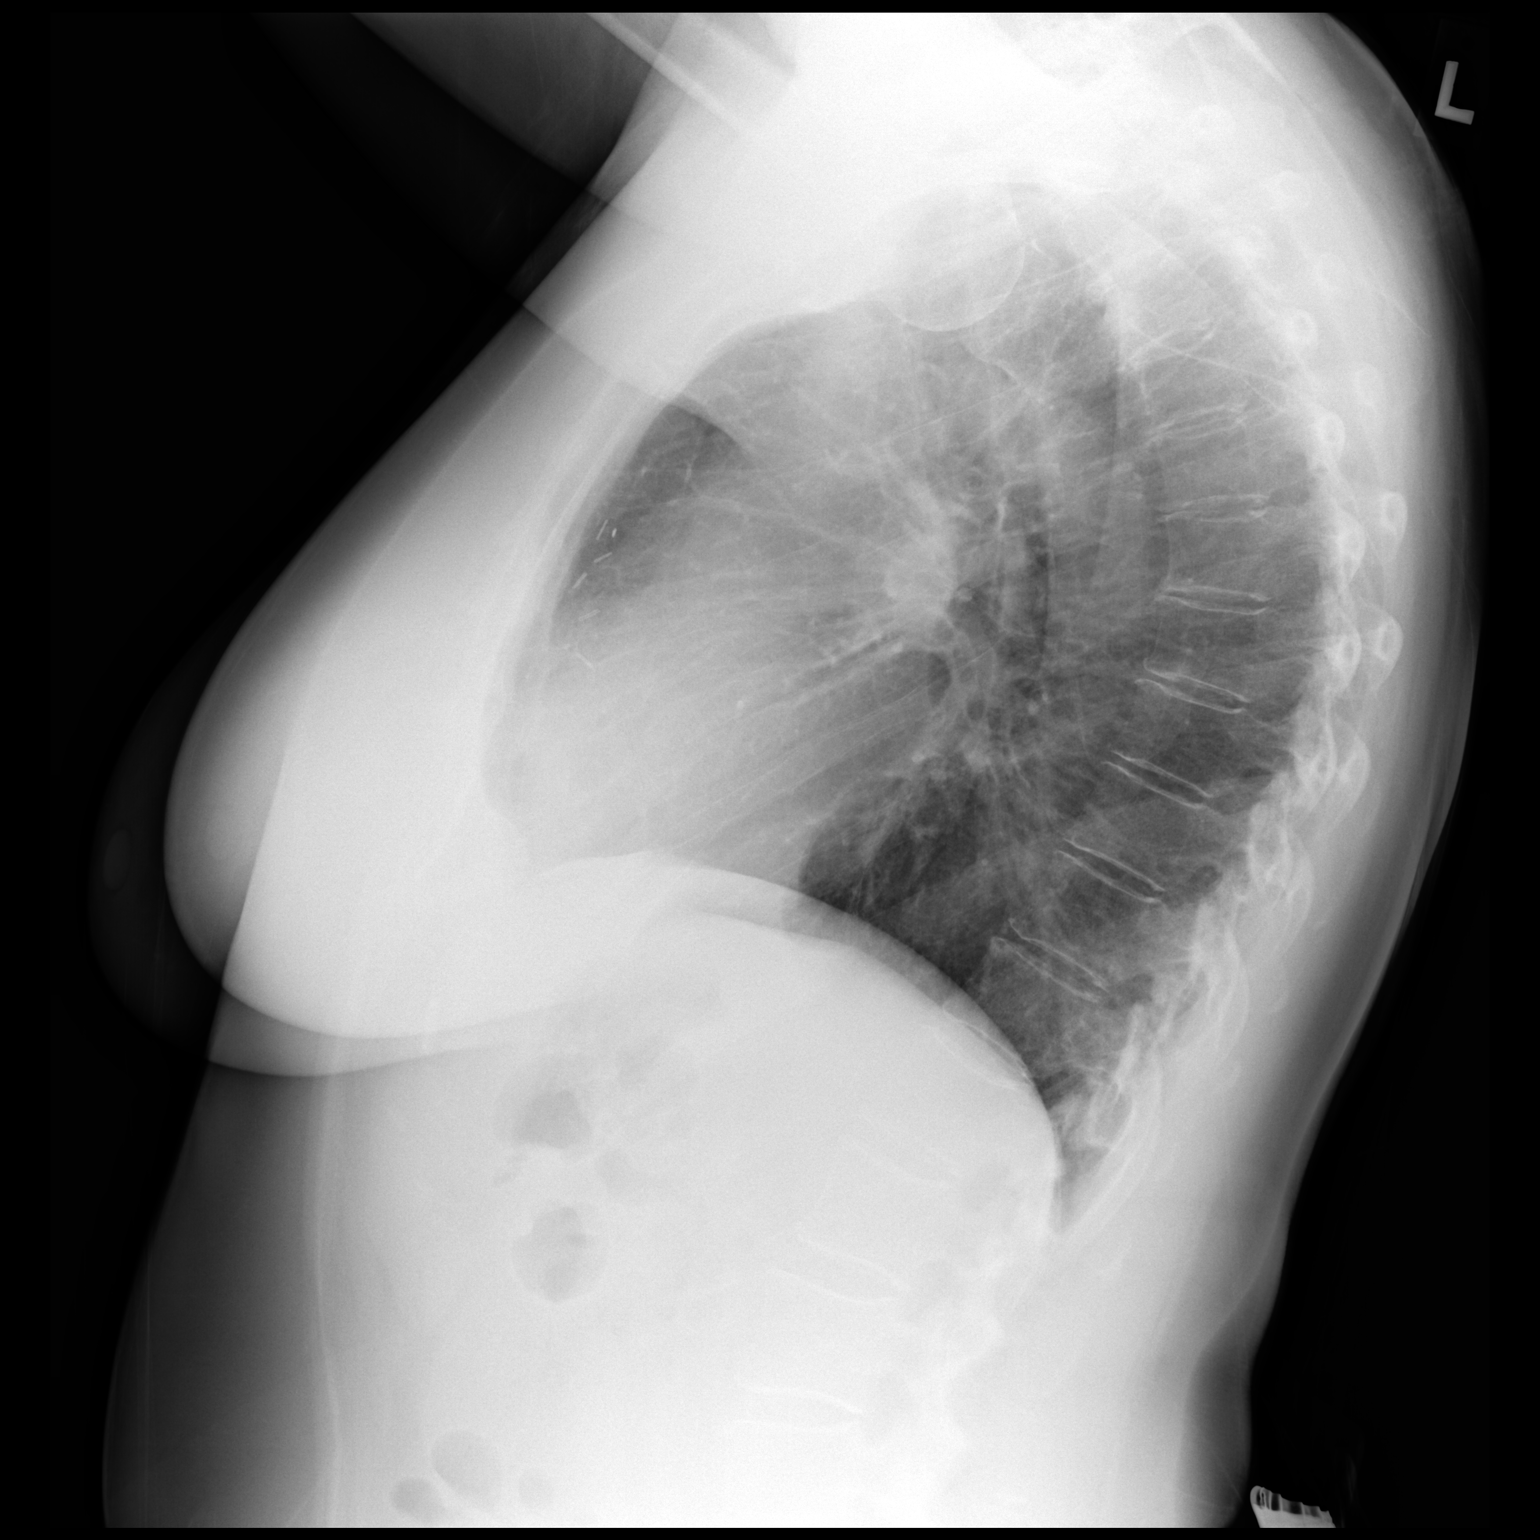

[2 of 2 positions shown; findings below may reference images not displayed]

FINDINGS: The heart size and mediastinal contours are within normal limits.
Both lungs are clear. The visualized skeletal structures are
unremarkable. Surgical clips overlie the LEFT breast.
IMPRESSION: No consolidation or edema.  No active disease.

## 2018-06-27 ENCOUNTER — Ambulatory Visit: Payer: Self-pay | Admitting: Nurse Practitioner

## 2018-06-27 ENCOUNTER — Other Ambulatory Visit: Payer: Self-pay

## 2018-07-03 ENCOUNTER — Telehealth: Payer: Self-pay

## 2018-07-03 NOTE — Telephone Encounter (Signed)
Called and left voicemail regarding pre-screening questions for appt on 6/25  

## 2018-07-04 ENCOUNTER — Telehealth: Payer: Self-pay

## 2018-07-04 ENCOUNTER — Inpatient Hospital Stay: Payer: Self-pay

## 2018-07-04 ENCOUNTER — Inpatient Hospital Stay: Payer: Self-pay | Admitting: Nurse Practitioner

## 2018-07-04 ENCOUNTER — Other Ambulatory Visit: Payer: Self-pay | Admitting: Nurse Practitioner

## 2018-07-04 ENCOUNTER — Telehealth: Payer: Self-pay | Admitting: Nurse Practitioner

## 2018-07-04 DIAGNOSIS — Z170 Estrogen receptor positive status [ER+]: Secondary | ICD-10-CM

## 2018-07-04 DIAGNOSIS — C50412 Malignant neoplasm of upper-outer quadrant of left female breast: Secondary | ICD-10-CM

## 2018-07-04 MED ORDER — TAMOXIFEN CITRATE 20 MG PO TABS: 20.0000 mg | ORAL_TABLET | Freq: Every day | ORAL | 0 refills | Status: DC

## 2018-07-04 NOTE — Progress Notes (Deleted)
[place]   Telephone:(336) [phone] Fax:(336) [phone]   Clinic Follow up Note   Patient Care Team: Harlan Stains, MD as PCP - General (Family Medicine) Kyung Rudd, MD as Consulting Physician (Radiation Oncology) Rolm Bookbinder, MD as Consulting Physician (General Surgery) Delice Bison, Charlestine Massed, NP as Nurse Practitioner (Hematology and Oncology) Truitt Merle, MD as Consulting Physician (Hematology) 07/04/2018  CHIEF COMPLAINT: f/u left breast cancer    SUMMARY OF ONCOLOGIC HISTORY: Oncology History Overview Note  Cancer Staging Malignant neoplasm of upper-outer quadrant of left female breast [facility]) Staging form: Breast, AJCC 7th Edition - Clinical stage from 12/15/2015: Stage Unknown ([place], N0, M0) - Unsigned Staging form: Breast, AJCC 8th Edition - Pathologic stage from 03/05/2016: Stage IA (pT1c, pN0, cM0, G1, ER: Positive, PR: Positive, HER2: Negative, Oncotype DX score: 8) - Signed by Truitt Merle, MD on 03/05/2016     Malignant neoplasm of upper-outer quadrant of left female breast ([place])  11/30/2015 Mammogram   Diagnostic mammogram of left breast showed persistent left breast architectural distortion in the upper outer quadrant, no responding findings on ultrasound, left axilla was negative on ultrasound.   12/01/2015 Initial Diagnosis   Malignant neoplasm of upper-outer quadrant of left female breast ([place])   12/01/2015 Initial Biopsy   Left breast core needle biopsy in the upper outer quadrant showed invasive ductal carcinoma, G1, and atypical ductal hyperplasia.   12/01/2015 Receptors her2   ER 90% positive, PR 100% positive, strong staining, HER-2 negative, Ki-67 5%    12/21/2015 Surgery   Left breast lumpectomy and sentinel lymph node biopsy.   12/21/2015 Pathology Results   Left breast lumpectomy showed invasive ductal carcinoma with calcifications, grade 1, 1.4 cm, low-grade DCIS, surgical margins were negative (greater than 0.2cm). 2 sentinel lymph  nodes were negative. No lymphovascular invasion.   12/21/2015 Oncotype testing   Recurrence score 8, which predicts 10 year risk of distant recurrence 6% with tamoxifen.   01/31/2016 - 02/25/2016 Radiation Therapy   1. The Left breast was treated to 42.5 Gy in 17 fractions at 2.5 Gy per fraction. 2. The Left breast was boosted to 7.5 in 3 fractions at 2.5 Gy per fraction.   03/2016 -  Anti-estrogen oral therapy   adjuvant letrozole 2.70m daily, changed to Tamoxifen due to arthralgia in June 2018   04/11/2016 Imaging   04/11/2016 DEXA ASSESSMENT: The BMD measured at Femur Neck Right is 0.914 g/cm2 with a T-score of -0.9.    12/22/2016 Mammogram   12/22/2016 Mammogram IMPRESSION: 1. No mammographic evidence of malignancy involving either breast. 2. Expected post lumpectomy and post radiation changes involving left breast. 3. Expected post surgical changes related to the prior excisional biopsy involving the upper outer quadrant of the right breast.   12/25/2017 Mammogram   IMPRESSION: No evidence of malignancy within either breast. Stable postsurgical changes within the LEFT breast.   RECOMMENDATION: Bilateral diagnostic mammogram in 1 year.     CURRENT THERAPY: Letrozole2.5 mg started 03/2016, changed to Tamoxifen in 06/2016 due to arthralgia.   INTERVAL HISTORY: Ms. HKrenzerreturns for f/u as scheduled. She was last seen in clinic in 12/2017 with mammogram.    REVIEW OF SYSTEMS:   Constitutional: Denies fevers, chills or abnormal weight loss Eyes: Denies blurriness of vision Ears, nose, mouth, throat, and face: Denies mucositis or sore throat Respiratory: Denies cough, dyspnea or wheezes Cardiovascular: Denies palpitation, chest discomfort or lower extremity swelling Gastrointestinal:  Denies nausea, heartburn or change in bowel habits Skin: Denies abnormal skin  rashes Lymphatics: Denies new lymphadenopathy or easy bruising Neurological:Denies numbness, tingling or new  weaknesses Behavioral/Psych: Mood is stable, no new changes  All other systems were reviewed with the patient and are negative.  MEDICAL HISTORY:  Past Medical History:  Diagnosis Date  . Allergy   . Breast cancer ([place]) 12/01/2015   Left Breast  . GERD (gastroesophageal reflux disease)   . 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 History of kidney stones   . Malignant neoplasm of upper-outer quadrant of left female breast ([place]) 12/07/2015  . Personal history of radiation therapy     SURGICAL HISTORY: Past Surgical History:  Procedure Laterality Date  . ABDOMINAL HYSTERECTOMY    . BREAST BIOPSY Left 12/01/2015  . BREAST LUMPECTOMY Left 12/21/2015   2017  . BREAST LUMPECTOMY WITH RADIOACTIVE SEED AND SENTINEL LYMPH NODE BIOPSY Left 12/21/2015   Procedure: LEFT BREAST LUMPECTOMY WITH RADIOACTIVE SEED AND SENTINEL LYMPH NODE BIOPSY;  Surgeon: Rolm Bookbinder, MD;  Location: [place];  Service: General;  Laterality: Left;  . BREAST SURGERY     breast lumpectomy ?2000   . COLONOSCOPY    . PILONIDAL CYST EXCISION      I have reviewed the social history and family history with the patient and they are unchanged from previous note.  ALLERGIES:  is allergic to penicillins.  MEDICATIONS:  Current Outpatient Medications  Medication Sig Dispense Refill  . APPLE CIDER VINEGAR PO Take by mouth.    . diphenhydramine-acetaminophen (TYLENOL PM) 25-500 MG TABS tablet Take 2 tablets by mouth at bedtime as needed (for sleep.).    Marland Kitchen Multiple Vitamins-Minerals (CENTRUM ADULTS PO) Take by mouth.    . tamoxifen (NOLVADEX) 20 MG tablet Take 1 tablet (20 mg total) by mouth daily. 90 tablet 1  . Vitamin D, Ergocalciferol, (DRISDOL) 50000 units CAPS capsule Take 50,000 Units by mouth every Monday.      No current facility-administered medications for this visit.     PHYSICAL EXAMINATION: ECOG PERFORMANCE STATUS: {CHL ONC ECOG PS:[phone]}  There were no vitals filed for this visit. There were  no vitals filed for this visit.  GENERAL:alert, no distress and comfortable SKIN: skin color, texture, turgor are normal, no rashes or significant lesions EYES: normal, Conjunctiva are pink and non-injected, sclera clear OROPHARYNX:no exudate, no erythema and lips, buccal mucosa, and tongue normal  NECK: supple, thyroid normal size, non-tender, without nodularity LYMPH:  no palpable lymphadenopathy in the cervical, axillary or inguinal LUNGS: clear to auscultation and percussion with normal breathing effort HEART: regular rate & rhythm and no murmurs and no lower extremity edema ABDOMEN:abdomen soft, non-tender and normal bowel sounds Musculoskeletal:no cyanosis of digits and no clubbing  NEURO: alert & oriented x 3 with fluent speech, no focal motor/sensory deficits  LABORATORY DATA:  I have reviewed the data as listed CBC Latest Ref Rng & Units 12/27/2017 09/15/2016 06/02/2016  WBC 4.0 - 10.5 K/uL 7.2 6.0 5.2  Hemoglobin 12.0 - 15.0 g/dL 13.0 12.6 13.3  Hematocrit 36.0 - 46.0 % 39.4 38.0 38.7  Platelets 150 - 400 K/uL 245 252 256     CMP Latest Ref Rng & Units 12/27/2017 09/15/2016 06/02/2016  Glucose 70 - 99 mg/dL 97 105 106  BUN 8 - 23 mg/dL 15 23.4 19.2  Creatinine 0.44 - 1.00 mg/dL 0.78 0.9 0.8  Sodium 135 - 145 mmol/L 144 140 144  Potassium 3.5 - 5.1 mmol/L 3.7 4.3 4.2  Chloride 98 - 111 mmol/L 109 - -  CO2 22 - 32 mmol/L 26 26 27   Calcium 8.9 - 10.3 mg/dL 9.0 9.3 9.2  Total Protein 6.5 - 8.1 g/dL 7.2 7.2 7.0  Total Bilirubin 0.3 - 1.2 mg/dL 0.4 0.86 0.58  Alkaline Phos 38 - 126 U/L 59 70 83  AST 15 - 41 U/L 10(L) 13 16  ALT 0 - 44 U/L 14 17 23       RADIOGRAPHIC STUDIES: I have personally reviewed the radiological images as listed and agreed with the findings in the report. No results found.   ASSESSMENT & PLAN:  No problem-specific Assessment & Plan notes found for this encounter.   No orders of the defined types were placed in this encounter.  All questions were  answered. The patient knows to call the clinic with any problems, questions or concerns. No barriers to learning was detected. I spent {CHL ONC TIME VISIT - VPLWU:[id number]} counseling the patient face to face. The total time spent in the appointment was {CHL ONC TIME VISIT - QIXMD:[id number]} and more than 50% was on counseling and review of test results     Alla Feeling, NP 07/04/18

## 2018-07-04 NOTE — Telephone Encounter (Signed)
R/s appt per 6/25 sch message - unable to reach pt - left message for patient with appt date and time

## 2018-07-04 NOTE — Telephone Encounter (Signed)
Patient left message to cancel appt for today and asked to be rescheduled to December.  Patient also needs refill on Tamoxifen.  Neeses for both per NP.

## 2018-11-12 ENCOUNTER — Other Ambulatory Visit: Payer: Self-pay | Admitting: Nurse Practitioner

## 2018-11-12 DIAGNOSIS — C50412 Malignant neoplasm of upper-outer quadrant of left female breast: Secondary | ICD-10-CM

## 2018-11-15 ENCOUNTER — Telehealth: Payer: Self-pay | Admitting: Hematology

## 2018-11-15 ENCOUNTER — Other Ambulatory Visit: Payer: Self-pay | Admitting: Hematology

## 2018-11-15 DIAGNOSIS — Z853 Personal history of malignant neoplasm of breast: Secondary | ICD-10-CM

## 2018-11-15 NOTE — Telephone Encounter (Signed)
Returned patient's phone call regarding rescheduling an appointment, per patient's request December appointment has moved to January.

## 2018-12-12 ENCOUNTER — Other Ambulatory Visit: Payer: Self-pay

## 2018-12-12 ENCOUNTER — Ambulatory Visit: Payer: Self-pay | Admitting: Nurse Practitioner

## 2019-01-01 ENCOUNTER — Encounter (HOSPITAL_BASED_OUTPATIENT_CLINIC_OR_DEPARTMENT_OTHER): Payer: Self-pay | Admitting: *Deleted

## 2019-01-01 ENCOUNTER — Other Ambulatory Visit: Payer: Self-pay

## 2019-01-01 ENCOUNTER — Emergency Department (HOSPITAL_BASED_OUTPATIENT_CLINIC_OR_DEPARTMENT_OTHER): Payer: Medicare HMO

## 2019-01-01 ENCOUNTER — Emergency Department (HOSPITAL_BASED_OUTPATIENT_CLINIC_OR_DEPARTMENT_OTHER)
Admission: EM | Admit: 2019-01-01 | Discharge: 2019-01-01 | Disposition: A | Discharge status: Home / Self Care | Payer: Medicare HMO | Attending: Emergency Medicine | Admitting: Emergency Medicine

## 2019-01-01 DIAGNOSIS — E876 Hypokalemia: Secondary | ICD-10-CM | POA: Diagnosis not present

## 2019-01-01 DIAGNOSIS — Z20828 Contact with and (suspected) exposure to other viral communicable diseases: Secondary | ICD-10-CM | POA: Insufficient documentation

## 2019-01-01 DIAGNOSIS — Z79899 Other long term (current) drug therapy: Secondary | ICD-10-CM | POA: Insufficient documentation

## 2019-01-01 DIAGNOSIS — R112 Nausea with vomiting, unspecified: Secondary | ICD-10-CM | POA: Diagnosis not present

## 2019-01-01 DIAGNOSIS — Z853 Personal history of malignant neoplasm of breast: Secondary | ICD-10-CM | POA: Insufficient documentation

## 2019-01-01 DIAGNOSIS — R1031 Right lower quadrant pain: Secondary | ICD-10-CM | POA: Diagnosis not present

## 2019-01-01 DIAGNOSIS — K219 Gastro-esophageal reflux disease without esophagitis: Secondary | ICD-10-CM | POA: Insufficient documentation

## 2019-01-01 DIAGNOSIS — R21 Rash and other nonspecific skin eruption: Secondary | ICD-10-CM | POA: Insufficient documentation

## 2019-01-01 DIAGNOSIS — R197 Diarrhea, unspecified: Secondary | ICD-10-CM | POA: Insufficient documentation

## 2019-01-01 LAB — COMPREHENSIVE METABOLIC PANEL
ALT: 153 U/L — ABNORMAL HIGH (ref 0–44)
AST: 60 U/L — ABNORMAL HIGH (ref 15–41)
Albumin: 2.8 g/dL — ABNORMAL LOW (ref 3.5–5.0)
Alkaline Phosphatase: 124 U/L (ref 38–126)
Anion gap: 9 (ref 5–15)
BUN: 12 mg/dL (ref 8–23)
CO2: 23 mmol/L (ref 22–32)
Calcium: 8.1 mg/dL — ABNORMAL LOW (ref 8.9–10.3)
Chloride: 104 mmol/L (ref 98–111)
Creatinine, Ser: 0.8 mg/dL (ref 0.44–1.00)
GFR calc Af Amer: >60 mL/min (ref >60)
GFR calc non Af Amer: >60 mL/min (ref >60)
Glucose, Bld: 130 mg/dL — ABNORMAL HIGH (ref 70–99)
Potassium: 2.6 mmol/L — CL (ref 3.5–5.1)
Sodium: 136 mmol/L (ref 135–145)
Total Bilirubin: 0.7 mg/dL (ref 0.3–1.2)
Total Protein: 6.3 g/dL — ABNORMAL LOW (ref 6.5–8.1)

## 2019-01-01 LAB — CBC WITH DIFFERENTIAL/PLATELET
Abs Immature Granulocytes: 0.08 10*3/uL — ABNORMAL HIGH (ref 0.00–0.07)
Basophils Absolute: 0 10*3/uL (ref 0.0–0.1)
Basophils Relative: 0 %
Eosinophils Absolute: 0 10*3/uL (ref 0.0–0.5)
Eosinophils Relative: 1 %
HCT: 34.7 % — ABNORMAL LOW (ref 36.0–46.0)
Hemoglobin: 11.8 g/dL — ABNORMAL LOW (ref 12.0–15.0)
Immature Granulocytes: 1 %
Lymphocytes Relative: 7 %
Lymphs Abs: 0.6 10*3/uL — ABNORMAL LOW (ref 0.7–4.0)
MCH: 31.3 pg (ref 26.0–34.0)
MCHC: 34 g/dL (ref 30.0–36.0)
MCV: 92 fL (ref 80.0–100.0)
Monocytes Absolute: 0.2 10*3/uL (ref 0.1–1.0)
Monocytes Relative: 3 %
Neutro Abs: 7.3 10*3/uL (ref 1.7–7.7)
Neutrophils Relative %: 88 %
Platelets: 94 10*3/uL — ABNORMAL LOW (ref 150–400)
RBC: 3.77 MIL/uL — ABNORMAL LOW (ref 3.87–5.11)
RDW: 13.2 % (ref 11.5–15.5)
WBC: 8.2 10*3/uL (ref 4.0–10.5)
nRBC: 0 % (ref 0.0–0.2)

## 2019-01-01 LAB — URINALYSIS, ROUTINE W REFLEX MICROSCOPIC
Glucose, UA: NEGATIVE mg/dL
Ketones, ur: 15 mg/dL — AB
Leukocytes,Ua: NEGATIVE
Nitrite: NEGATIVE
Protein, ur: 100 mg/dL — AB
Specific Gravity, Urine: 1.02 (ref 1.005–1.030)
pH: 6 (ref 5.0–8.0)

## 2019-01-01 LAB — URINALYSIS, MICROSCOPIC (REFLEX)

## 2019-01-01 LAB — SARS CORONAVIRUS 2 AG (30 MIN TAT): SARS Coronavirus 2 Ag: NEGATIVE

## 2019-01-01 LAB — LIPASE, BLOOD: Lipase: 25 U/L (ref 11–51)

## 2019-01-01 MED ORDER — ONDANSETRON HCL 4 MG/2ML IJ SOLN
4.0000 mg | Freq: Once | INTRAMUSCULAR | Status: AC
  Administered 2019-01-01: 4 mg via INTRAVENOUS
  Filled 2019-01-01: qty 2

## 2019-01-01 MED ORDER — POTASSIUM CHLORIDE CRYS ER 20 MEQ PO TBCR: 20.0000 meq | EXTENDED_RELEASE_TABLET | Freq: Two times a day (BID) | ORAL | 0 refills | Status: DC

## 2019-01-01 MED ORDER — POTASSIUM CHLORIDE CRYS ER 20 MEQ PO TBCR
40.0000 meq | EXTENDED_RELEASE_TABLET | Freq: Once | ORAL | Status: AC
  Administered 2019-01-01: 40 meq via ORAL
  Filled 2019-01-01: qty 2

## 2019-01-01 MED ORDER — ONDANSETRON HCL 4 MG PO TABS: 4.0000 mg | ORAL_TABLET | Freq: Four times a day (QID) | ORAL | 0 refills | Status: AC

## 2019-01-01 MED ORDER — POTASSIUM CHLORIDE 10 MEQ/100ML IV SOLN
10.0000 meq | INTRAVENOUS | Status: AC
  Administered 2019-01-01 (×2): 10 meq via INTRAVENOUS
  Filled 2019-01-01 (×2): qty 100

## 2019-01-01 MED ORDER — IOHEXOL 300 MG/ML  SOLN
100.0000 mL | Freq: Once | INTRAMUSCULAR | Status: AC | PRN
  Administered 2019-01-01: 100 mL via INTRAVENOUS

## 2019-01-01 MED ORDER — SODIUM CHLORIDE 0.9 % IV BOLUS
1000.0000 mL | Freq: Once | INTRAVENOUS | Status: AC
  Administered 2019-01-01: 17:00:00 1000 mL via INTRAVENOUS

## 2019-01-01 MED ORDER — DIPHENHYDRAMINE HCL 25 MG PO CAPS
25.0000 mg | ORAL_CAPSULE | Freq: Once | ORAL | Status: AC
  Administered 2019-01-01: 25 mg via ORAL
  Filled 2019-01-01: qty 1

## 2019-01-01 NOTE — Discharge Instructions (Addendum)
Take potassium as prescribed.  Use Zofran as nausea as discussed.  Please use Tylenol for any fevers.  Stay hydrated.  Obtain a pulse oximeter if able to check your oxygen status.  Please return to the ED if your pulse oximeter is consistently below 90% or if you have worsening respiratory symptoms or other concerning symptoms.  We suspect that you have coronavirus.  Continue self-isolation.  Continue to follow-up with your primary care doctor as well.

## 2019-01-01 NOTE — ED Triage Notes (Signed)
Pt c/o h/a, cough, fever ,n/v/d, bodyaches x 3 days , neg covid x 1 day ago

## 2019-01-01 NOTE — ED Notes (Signed)
ED Provider at bedside. 

## 2019-01-01 NOTE — ED Notes (Signed)
Date and time results received: 01/01/19 1754  Test: potassium Critical Value: 2.6 Name of Provider Notified:[place] Orders Received? Or Actions Taken?: no orders given

## 2019-01-01 NOTE — ED Provider Notes (Signed)
[facility] EMERGENCY DEPARTMENT Provider Note   CSN: [id number] Arrival date & time: 01/01/19  1605     History Chief Complaint  Patient presents with  . Emesis    Laurie Calhoun is a 63 y.o. female.  The history is provided by the patient.  Emesis Severity:  Moderate Duration:  6 days Timing:  Intermittent Able to tolerate:  Liquids Progression:  Unchanged Chronicity:  New Relieved by:  Nothing Worsened by:  Nothing Associated symptoms: chills, cough, diarrhea, fever and myalgias   Associated symptoms: no abdominal pain, no arthralgias and no sore throat   Associated symptoms comment:  Rash Risk factors: no sick contacts and no suspect food intake        Past Medical History:  Diagnosis Date  . Allergy   . Breast cancer ([place]) 12/01/2015   Left Breast  . GERD (gastroesophageal reflux disease)   . 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 History of kidney stones   . 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 Personal history of radiation therapy     Patient Active Problem List   Diagnosis Date Noted  . Malignant neoplasm of upper-outer quadrant of left female breast ([place]) 12/07/2015    Past Surgical History:  Procedure Laterality Date  . ABDOMINAL HYSTERECTOMY    . BREAST BIOPSY Left 12/01/2015  . BREAST LUMPECTOMY Left 12/21/2015   2017  . BREAST LUMPECTOMY WITH RADIOACTIVE SEED AND SENTINEL LYMPH NODE BIOPSY Left 12/21/2015   Procedure: LEFT BREAST LUMPECTOMY WITH RADIOACTIVE SEED AND SENTINEL LYMPH NODE BIOPSY;  Surgeon: Rolm Bookbinder, MD;  Location: [facility];  Service: General;  Laterality: Left;  . BREAST SURGERY     breast lumpectomy ?2000   . COLONOSCOPY    . PILONIDAL CYST EXCISION       OB History   No obstetric history on file.     Family History  Problem Relation Age of Onset  . Lung cancer Mother   . Cancer Mother        lung cancer  . Cancer Father        GI cancer   .  Cancer Cousin 22       breast cancer     Social History   Tobacco Use  . Smoking status: Never Smoker  . Smokeless tobacco: Never Used  Substance Use Topics  . Alcohol use: Yes    Alcohol/week: 2.0 standard drinks    Types: 2 Glasses of wine per week  . Drug use: No    Home Medications Prior to Admission medications   Medication Sig Start Date End Date Taking? Authorizing Provider  APPLE CIDER VINEGAR PO Take by mouth.    [provider]  diphenhydramine-acetaminophen (TYLENOL PM) 25-500 MG TABS tablet Take 2 tablets by mouth at bedtime as needed (for sleep.).    [provider]  Multiple Vitamins-Minerals (CENTRUM ADULTS PO) Take by mouth.    [provider]  ondansetron (ZOFRAN) 4 MG tablet Take 1 tablet (4 mg total) by mouth every 6 (six) hours for 20 doses. 01/01/19 01/06/19  Jadis Pitter, DO  potassium chloride SA (KLOR-CON) 20 MEQ tablet Take 1 tablet (20 mEq total) by mouth 2 (two) times daily for 5 days. 01/01/19 01/06/19  Jackeline Gutknecht, DO  tamoxifen (NOLVADEX) 20 MG tablet TAKE 1 TABLET BY MOUTH EVERY DAY 11/12/18   Alla Feeling, NP  Vitamin D, Ergocalciferol, (DRISDOL) 50000 units CAPS capsule  Take 50,000 Units by mouth every Monday.     [provider]    Allergies    Penicillins  Review of Systems   Review of Systems  Constitutional: Positive for chills and fever.  HENT: Negative for ear pain and sore throat.   Eyes: Negative for pain and visual disturbance.  Respiratory: Positive for cough. Negative for shortness of breath.   Cardiovascular: Negative for chest pain and palpitations.  Gastrointestinal: Positive for diarrhea, nausea and vomiting. Negative for abdominal pain.  Genitourinary: Negative for dysuria and hematuria.  Musculoskeletal: Positive for myalgias. Negative for arthralgias and back pain.  Skin: Negative for color change and rash.  Neurological: Negative for seizures and syncope.  All other systems  reviewed and are negative.   Physical Exam Updated Vital Signs  ED Triage Vitals  Enc Vitals Group     BP 01/01/19 1612 110/68     Pulse Rate 01/01/19 1612 (!) 104     Resp 01/01/19 1612 18     Temp 01/01/19 1612 98.5 F (36.9 C)     Temp Source 01/01/19 1612 Oral     SpO2 01/01/19 1612 98 %     Weight 01/01/19 1610 163 lb (73.9 kg)     Height 01/01/19 1610 5\' 6"  (1.676 m)     Head Circumference --      Peak Flow --      Pain Score 01/01/19 1610 3     Pain Loc --      Pain Edu? --      Excl. in [place]? --     Physical Exam Vitals and nursing note reviewed.  Constitutional:      General: She is not in acute distress.    Appearance: She is well-developed. She is not ill-appearing.  HENT:     Head: Normocephalic and atraumatic.     Mouth/Throat:     Mouth: Mucous membranes are dry.  Eyes:     Extraocular Movements: Extraocular movements intact.     Conjunctiva/sclera: Conjunctivae normal.     Pupils: Pupils are equal, round, and reactive to light.  Cardiovascular:     Rate and Rhythm: Normal rate and regular rhythm.     Pulses: Normal pulses.     Heart sounds: Normal heart sounds. No murmur.  Pulmonary:     Effort: Pulmonary effort is normal. No respiratory distress.     Breath sounds: Normal breath sounds.  Abdominal:     General: There is no distension.     Palpations: Abdomen is soft.     Tenderness: There is no abdominal tenderness.  Musculoskeletal:        General: Normal range of motion.     Cervical back: Neck supple.  Skin:    General: Skin is warm and dry.     Capillary Refill: Capillary refill takes less than 2 seconds.     Findings: Rash (target type blanchable rash to abdomen and chest) present.  Neurological:     General: No focal deficit present.     Mental Status: She is alert.     ED Results / Procedures / Treatments   Labs (all labs ordered are listed, but only abnormal results are displayed) Labs Reviewed  CBC WITH DIFFERENTIAL/PLATELET -  Abnormal; Notable for the following components:      Result Value   RBC 3.77 (*)    Hemoglobin 11.8 (*)    HCT 34.7 (*)    Platelets 94 (*)    Lymphs Abs 0.6 (*)  Abs Immature Granulocytes 0.08 (*)    All other components within normal limits  COMPREHENSIVE METABOLIC PANEL - Abnormal; Notable for the following components:   Potassium 2.6 (*)    Glucose, Bld 130 (*)    Calcium 8.1 (*)    Total Protein 6.3 (*)    Albumin 2.8 (*)    AST 60 (*)    ALT 153 (*)    All other components within normal limits  URINALYSIS, ROUTINE W REFLEX MICROSCOPIC - Abnormal; Notable for the following components:   Color, Urine AMBER (*)    APPearance CLOUDY (*)    Hgb urine dipstick TRACE (*)    Bilirubin Urine SMALL (*)    Ketones, ur 15 (*)    Protein, ur 100 (*)    All other components within normal limits  URINALYSIS, MICROSCOPIC (REFLEX) - Abnormal; Notable for the following components:   Bacteria, UA MANY (*)    All other components within normal limits  SARS CORONAVIRUS 2 AG (30 MIN TAT)  SARS CORONAVIRUS 2 (TAT 6-24 HRS)  LIPASE, BLOOD    EKG EKG Interpretation  Date/Time:  Wednesday January 01 2019 18:03:41 EST Ventricular Rate:  106 PR Interval:    QRS Duration: 89 QT Interval:  330 QTC Calculation: 439 R Axis:   70 Text Interpretation: Sinus tachycardia Borderline T wave abnormalities Confirmed by Lennice Sites [phone]) on 01/01/2019 6:06:36 PM   Radiology CT ABDOMEN PELVIS W CONTRAST  Result Date: 01/01/2019 CLINICAL DATA:  63 year old female with nausea vomiting. EXAM: CT ABDOMEN AND PELVIS WITH CONTRAST TECHNIQUE: Multidetector CT imaging of the abdomen and pelvis was performed using the standard protocol following bolus administration of intravenous contrast. CONTRAST:  154mL OMNIPAQUE IOHEXOL 300 MG/ML  SOLN COMPARISON:  CT of the abdomen pelvis dated 12/11/2014. FINDINGS: Evaluation of this exam is limited due to respiratory motion artifact. Lower chest: Bilateral  subpleural hazy densities concerning for multifocal pneumonia, possibly atypical or viral in etiology including COVID-19. Clinical correlation is recommended. Small bilateral pleural effusions noted. There is diffuse interstitial and interlobular septal thickening which may represent edema. No intra-abdominal free air or free fluid. Hepatobiliary: Probable mild fatty infiltration of the liver. No intrahepatic biliary ductal dilatation. The gallbladder is unremarkable. Pancreas: Unremarkable. No pancreatic ductal dilatation or surrounding inflammatory changes. Spleen: Normal in size without focal abnormality. Adrenals/Urinary Tract: The adrenal glands are unremarkable. There is no hydronephrosis on either side. There is symmetric enhancement and excretion of contrast by both kidneys. The visualized ureters and urinary bladder appear unremarkable. Stomach/Bowel: There is loose stool throughout the colon compatible with diarrheal state. Correlation with clinical exam and stool cultures recommended. There is no bowel obstruction. The appendix is normal. Vascular/Lymphatic: The abdominal aorta and IVC are unremarkable. No portal venous gas. There is no adenopathy. There is a retroaortic left renal vein anatomy. Reproductive: Hysterectomy. No adnexal masses. Other: None Musculoskeletal: Degenerative changes of the spine. No acute osseous pathology. IMPRESSION: 1. Diarrheal state. Correlation with clinical exam and stool cultures recommended. No bowel obstruction. Normal appendix. 2. Multifocal pneumonia, possibly atypical or viral in etiology. Clinical correlation is recommended. Small bilateral pleural effusions. Electronically Signed   By: Anner Crete M.D.   On: 01/01/2019 19:11   DG Chest Portable 1 View  Result Date: 01/01/2019 CLINICAL DATA:  Cough EXAM: PORTABLE CHEST 1 VIEW COMPARISON:  None. FINDINGS: The heart size and mediastinal contours are within normal limits. Mildly increased hazy airspace opacity  seen at both lower lungs. No pleural effusion. No acute  osseous abnormality. IMPRESSION: Mildly increased hazy airspace opacity in both lower lungs. This could be due to atelectasis and/or infectious etiology. Electronically Signed   By: Prudencio Pair M.D.   On: 01/01/2019 19:07    Procedures Procedures (including critical care time)  Medications Ordered in ED Medications  potassium chloride 10 mEq in 100 mL IVPB (10 mEq Intravenous New Bag/Given 01/01/19 1811)  sodium chloride 0.9 % bolus 1,000 mL ( Intravenous Stopped 01/01/19 1812)  ondansetron (ZOFRAN) injection 4 mg (4 mg Intravenous Given 01/01/19 1709)  diphenhydrAMINE (BENADRYL) capsule 25 mg (25 mg Oral Given 01/01/19 1710)  potassium chloride SA (KLOR-CON) CR tablet 40 mEq (40 mEq Oral Given 01/01/19 1807)  iohexol (OMNIPAQUE) 300 MG/ML solution 100 mL (100 mLs Intravenous Contrast Given 01/01/19 1852)    ED Course  I have reviewed the triage vital signs and the nursing notes.  Pertinent labs & imaging results that were available during my care of the patient were reviewed by me and considered in my medical decision making (see chart for details).    MDM Rules/Calculators/A&P                       Maidah Shorb is a 63 year old female with history of breast cancer in remission who presents to the ED with nausea, vomiting, abdominal pain, concern for dehydration.  Patient with overall unremarkable vitals.  No fever.  Patient has had GI symptoms for the past 6 days.  Has had 2 coronavirus test negative during this time.  Has had low-grade fever that has responded well to Tylenol.  She started to notice a rash on her abdomen and chest today as well.  No known sick contacts.  No suspicious food intake.  Does have some lower abdominal pain mostly right-sided.  Denies any urinary symptoms.  She has had a cough.  She has a target-like blanchable rash on her abdomen and chest.  No involvement of her mucosa.  She is not on  antibiotics.  Overall suspect viral process.  Repeat coronavirus test is negative.  Will get lab work, CT scan abdomen pelvis to evaluate for colitis or other intra-abdominal process.  Will give IV fluids, IV Zofran.  Will give Benadryl for rash.  Will evaluate for dehydration.    Potassium 2.6.  This was repleted with 2 IV runs of potassium.  Oral potassium also given.  Otherwise electrolytes unremarkable.  Creatinine unremarkable.  No significant anemia, electrolyte abnormality, kidney injury.Urinalysis consistent with mild dehydration.  Urine culture has been sent.  However patient has negative nitrates and leukocytes.  Likely dirty catch.  Will hold antibiotics at this time despite microscopy showing some bacteria.  CT scan abdomen pelvis showed findings consistent with a diarrheal state.  No bowel obstruction.  No appendicitis.  Incidentally lower chest shows likely multifocal pneumonia likely coronavirus.  Rapid Covid test was negative.  Scan likely looks consistent with an atypical or viral pneumonia.  Patient however denies any respiratory distress.  Has had a mild cough.  She is able to ambulate in the room without any symptoms.  Pulse ox normal both at rest and with ambulation.  Discussed possible admission for hydration and further observation but patient prefers discharge with potassium and Zofran.  She understands return precautions.  Discharged in good condition.  Retested for coronavirus.  This chart was dictated using voice recognition software.  Despite best efforts to proofread,  errors can occur which can change the documentation meaning.  Final Clinical Impression(s) / ED Diagnoses Final diagnoses:  Nausea vomiting and diarrhea  Hypokalemia    Rx / DC Orders ED Discharge Orders         Ordered    ondansetron (ZOFRAN) 4 MG tablet  Every 6 hours     01/01/19 1933    potassium chloride SA (KLOR-CON) 20 MEQ tablet  2 times daily     01/01/19 1933           Lennice Sites,  DO 01/01/19 1937

## 2019-01-02 ENCOUNTER — Telehealth: Payer: Self-pay | Admitting: General Practice

## 2019-01-02 LAB — SARS CORONAVIRUS 2 (TAT 6-24 HRS): SARS Coronavirus 2: NEGATIVE

## 2019-01-02 NOTE — Telephone Encounter (Signed)
Negative COVID results given. Patient results "NOT Detected." Caller expressed understanding. ° °

## 2019-01-13 ENCOUNTER — Other Ambulatory Visit: Payer: Self-pay | Admitting: Family Medicine

## 2019-01-13 ENCOUNTER — Other Ambulatory Visit: Payer: Self-pay

## 2019-01-13 ENCOUNTER — Ambulatory Visit
Admission: RE | Admit: 2019-01-13 | Discharge: 2019-01-13 | Disposition: A | Discharge status: Home / Self Care | Payer: Medicare Other | Source: Ambulatory Visit | Attending: Family Medicine | Admitting: Family Medicine

## 2019-01-13 DIAGNOSIS — R9389 Abnormal findings on diagnostic imaging of other specified body structures: Secondary | ICD-10-CM

## 2019-01-15 ENCOUNTER — Ambulatory Visit
Admission: RE | Admit: 2019-01-15 | Discharge: 2019-01-15 | Disposition: A | Discharge status: Home / Self Care | Payer: Medicare Other | Source: Ambulatory Visit | Attending: Hematology | Admitting: Hematology

## 2019-01-15 ENCOUNTER — Other Ambulatory Visit: Payer: Self-pay

## 2019-01-15 DIAGNOSIS — Z853 Personal history of malignant neoplasm of breast: Secondary | ICD-10-CM

## 2019-01-19 NOTE — Progress Notes (Signed)
[place]   [phone] Fax:(336) [phone]   Clinic Follow up Note   Patient Care Team: Laurie Stains, MD as PCP - General (Family Medicine) Laurie Rudd, MD as Consulting Physician (Radiation Oncology) Laurie Bookbinder, MD as Consulting Physician (General Surgery) Laurie Calhoun, Laurie Massed, NP as Nurse Practitioner (Hematology and Oncology) Laurie Merle, MD as Consulting Physician (Hematology) 01/20/2019  CHIEF COMPLAINT: F/u left breast cancer   SUMMARY OF ONCOLOGIC HISTORY: Oncology History Overview Note  Cancer Staging Malignant neoplasm of upper-outer quadrant of left female breast [facility]) Staging form: Breast, AJCC 7th Edition - Clinical stage from 12/15/2015: Stage Unknown ([place], N0, M0) - Unsigned Staging form: Breast, AJCC 8th Edition - Pathologic stage from 03/05/2016: Stage IA (pT1c, pN0, cM0, G1, ER: Positive, PR: Positive, HER2: Negative, Oncotype DX score: 8) - Signed by Laurie Merle, MD on 03/05/2016     Malignant neoplasm of upper-outer quadrant of left female breast ([place])  11/30/2015 Mammogram   Diagnostic mammogram of left breast showed persistent left breast architectural distortion in the upper outer quadrant, no responding findings on ultrasound, left axilla was negative on ultrasound.   12/01/2015 Initial Diagnosis   Malignant neoplasm of upper-outer quadrant of left female breast ([place])   12/01/2015 Initial Biopsy   Left breast core needle biopsy in the upper outer quadrant showed invasive ductal carcinoma, G1, and atypical ductal hyperplasia.   12/01/2015 Receptors her2   ER 90% positive, PR 100% positive, strong staining, HER-2 negative, Ki-67 5%    12/21/2015 Surgery   Left breast lumpectomy and sentinel lymph node biopsy.   12/21/2015 Pathology Results   Left breast lumpectomy showed invasive ductal carcinoma with calcifications, grade 1, 1.4 cm, low-grade DCIS, surgical margins were negative (greater than 0.2cm). 2 sentinel lymph  nodes were negative. No lymphovascular invasion.   12/21/2015 Oncotype testing   Recurrence score 8, which predicts 10 year risk of distant recurrence 6% with tamoxifen.   01/31/2016 - 02/25/2016 Radiation Therapy   1. The Left breast was treated to 42.5 Gy in 17 fractions at 2.5 Gy per fraction. 2. The Left breast was boosted to 7.5 in 3 fractions at 2.5 Gy per fraction.   03/2016 -  Anti-estrogen oral therapy   adjuvant letrozole 2.56m daily, changed to Tamoxifen due to arthralgia in June 2018   04/11/2016 Imaging   04/11/2016 DEXA ASSESSMENT: The BMD measured at Femur Neck Right is 0.914 g/cm2 with a T-score of -0.9.    12/22/2016 Mammogram   12/22/2016 Mammogram IMPRESSION: 1. No mammographic evidence of malignancy involving either breast. 2. Expected post lumpectomy and post radiation changes involving left breast. 3. Expected post surgical changes related to the prior excisional biopsy involving the upper outer quadrant of the right breast.   12/25/2017 Mammogram   IMPRESSION: No evidence of malignancy within either breast. Stable postsurgical changes within the LEFT breast.   RECOMMENDATION: Bilateral diagnostic mammogram in 1 year.   01/15/2019 Mammogram   IMPRESSION: Expected post lumpectomy changes in the left breast. No mammographic evidence of malignancy in the bilateral breasts.       CURRENT THERAPY: Letrozole2.5 mg started 03/2016, changed to Tamoxifen in 06/2016 due to arthralgia.   INTERVAL HISTORY: [name] for annual f/u as scheduled. She was last seen on 12/27/2017. She had febrile illness in December with n/v/d, headaches, rash, chills, and "not breathing right." She went to ED on1 2/23 and was found to be dehydrated and hypokalemic. CT AP showed diarrheal state and multifocal pneumonia with bilateral  pleural effusions. She was given supportive care and antibiotics plus prednisone for her rash which was biopsied last week, has not resolved. She had 4  negative covid tests, was not tested for flu or other respiratory panel. C.dif negative. She has since recovered except she has fatigue. Remains functional but could fall asleep right now. Appetite is normal. No concerns in her breasts. No new bone/joint pain. Denies hot flashes. Fever, chills, dyspnea, n/v/d resolved.    MEDICAL HISTORY:  Past Medical History:  Diagnosis Date  . Allergy   . Breast cancer ([place]) 12/01/2015   Left Breast  . GERD (gastroesophageal reflux disease)   . 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 History of kidney stones   . Malignant neoplasm of upper-outer quadrant of left female breast ([place]) 12/07/2015  . Personal history of radiation therapy     SURGICAL HISTORY: Past Surgical History:  Procedure Laterality Date  . ABDOMINAL HYSTERECTOMY    . BREAST BIOPSY Left 12/01/2015  . BREAST LUMPECTOMY Left 12/21/2015   2017  . BREAST LUMPECTOMY WITH RADIOACTIVE SEED AND SENTINEL LYMPH NODE BIOPSY Left 12/21/2015   Procedure: LEFT BREAST LUMPECTOMY WITH RADIOACTIVE SEED AND SENTINEL LYMPH NODE BIOPSY;  Surgeon: Laurie Bookbinder, MD;  Location: [facility];  Service: General;  Laterality: Left;  . BREAST SURGERY     breast lumpectomy ?2000   . COLONOSCOPY    . PILONIDAL CYST EXCISION      I have reviewed the social history and family history with the patient and they are unchanged from previous note.  ALLERGIES:  is allergic to penicillins.  MEDICATIONS:  Current Outpatient Medications  Medication Sig Dispense Refill  . APPLE CIDER VINEGAR PO Take by mouth.    . diphenhydramine-acetaminophen (TYLENOL PM) 25-500 MG TABS tablet Take 2 tablets by mouth at bedtime as needed (for sleep.).    Marland Kitchen Multiple Vitamins-Minerals (CENTRUM ADULTS PO) Take by mouth.    . potassium chloride SA (KLOR-CON) 20 MEQ tablet Take 1 tablet (20 mEq total) by mouth 2 (two) times daily for 5 days. 10 tablet 0  . tamoxifen (NOLVADEX) 20 MG tablet Take 1 tablet (20 mg total) by mouth daily. 90  tablet 2  . Vitamin D, Ergocalciferol, (DRISDOL) 50000 units CAPS capsule Take 50,000 Units by mouth every Monday.      No current facility-administered medications for this visit.    PHYSICAL EXAMINATION: ECOG PERFORMANCE STATUS: 0 - Asymptomatic  Vitals:   01/20/19 1100  BP: (!) 141/79  Pulse: (!) 115  Resp: 17  Temp: 98.2 F (36.8 C)  SpO2: 100%   Filed Weights   01/20/19 1100  Weight: 171 lb 11.2 oz (77.9 kg)    GENERAL:alert, no distress and comfortable SKIN: diffuse body rash EYES: sclera clear NECK: without mass LYMPH:  no palpable cervical, supraclavicular, or axillary lymphadenopathy LUNGS: clear, slightly diminished bases with normal breathing effort HEART: regular rate & rhythm, no lower extremity edema ABDOMEN: abdomen soft, non-tender and normal bowel sounds Musculoskeletal:full ROM upper extremities  NEURO: alert & oriented x 3 with fluent speech, no focal motor/sensory deficits Breast: s/p left lumpectomy. Scars all healed. No palpable mass in either breast or axilla that I could appreciate. Breasts were tender bilaterally, at baseline   LABORATORY DATA:  I have reviewed the data as listed CBC Latest Ref Rng & Units 01/20/2019 01/01/2019 12/27/2017  WBC 4.0 - 10.5 K/uL 6.1 8.2 7.2  Hemoglobin 12.0 - 15.0 g/dL 12.5 11.8(L) 13.0  Hematocrit 36.0 -  46.0 % 37.4 34.7(L) 39.4  Platelets 150 - 400 K/uL 282 94(L) 245     CMP Latest Ref Rng & Units 01/20/2019 01/01/2019 12/27/2017  Glucose 70 - 99 mg/dL 126(H) 130(H) 97  BUN 8 - 23 mg/dL _0 Creatinine 0.44 - 1.00 mg/dL 0.76 0.80 0.78  Sodium 135 - 145 mmol/L 141 136 144  Potassium 3.5 - 5.1 mmol/L 3.5 2.6(LL) 3.7  Chloride 98 - 111 mmol/L 108 104 109  CO2 22 - 32 mmol/L _1 Calcium 8.9 - 10.3 mg/dL 8.2(L) 8.1(L) 9.0  Total Protein 6.5 - 8.1 g/dL 7.1 6.3(L) 7.2  Total Bilirubin 0.3 - 1.2 mg/dL 0.9 0.7 0.4  Alkaline Phos 38 - 126 U/L 57 124 59  AST 15 - 41 U/L 12(L) 60(H) 10(L)  ALT 0 - 44 U/L  26 153(H) 14      RADIOGRAPHIC STUDIES: I have personally reviewed the radiological images as listed and agreed with the findings in the report. No results found.   ASSESSMENT & PLAN: Laurie Calhoun is a 64 y.o. female with   1. Malignant neoplasm of upper-outer quadrant of left breast, invasive ductal carcinoma, grade 1, pT1cN0M0, ER+/PR+/HER2- -She was diagnosed in 11/2015. She is s/p left breast lumpectomy and adjuvant radiation. Due to low risk oncotype score of 8, adjuvant chemotherapy was not recommended. She started letrozole in 03/2016 but due to arthralgia we switched her to Tamoxifen in 06/2016, arthralgia resolved then.  -She has had a hysterectomy, no risk of endometrial cancer from tamoxifen. -On surveillance  -Ms. Feeback is clinically doing well. Labs unremarkable. Physical exam benign. No concern for breast cancer recurrence. She is now over 3 years out from her diagnosis  -We reviewed mammogram from 01/15/19 which is negative.  -Continue breast cancer surveillance and tamoxifen which I refilled today -we reviewed signs of recurrence such as unexplained fatigue, weight loss, new bone pain, abdominal pain -virtual visit in 6 months, mammogram in 1/22, and lab with in-person f/u in 1 year  2. Fatigue, recent febrile illness in 12/2018 -She developed fever to 101 with chills, n/v, rash, headaches, and dyspnea in 12/2018. She reportedly had 4 negative COVID19 tests -CT AP 12/23 showed diarrhea state, no bowel obstruction. Lower lungs showed multifocal pneumonia and small bilateral effusion. She also had mild anemia at the time, Hgb 11.8 and thrombocytopenia PLT 94K.  -She was treated with antibiotics and prednisone -repeat chest xray on 1/4 showed lungs are clear, no acute process -all symptoms resolved except skin rash. Rash was biopsied last week. She also remains fatigued, functional but with effort, and requires rest.  -no other respiratory or concerning symptoms  -prolonged fatigue is likely related to infection, should resolve gradually. She will let me know if fatigue worsens or fails to improve -Hgb normalized, 12.5 today, WBC remains normal  -continue f/u with PCP   3. Bone Health -05/08/2016 DEXA was normal with T score of -0.9at right femur neck.  -Continue calcium and vitamin D -given normal DEXA and bone-strengthening qualities of tamoxifen, she can postpone next DEXA to later in 2021. I encouraged her to continue weight bearing exercise, calcium, and vitamin D   PLAN: -Labs, mammogram reviewed  -Phone visit 6 months -Lab, f/u in 1 year -Refilled tamoxifen -Continue f/u with PCP -Reviewed s/sx concerning for breast cancer recurrence/metastasis -Patient will call to let me know skin biopsy results -Patient will call if fatigue worsens or fails to improve    All questions were  answered. The patient knows to call the clinic with any problems, questions or concerns. No barriers to learning were detected. I spent 20 minutes counseling the patient face to face. The total time spent in the appointment was 25 minutes and more than 50% was on counseling and review of test results     Laurie Feeling, NP 01/20/19

## 2019-01-20 ENCOUNTER — Encounter: Payer: Self-pay | Admitting: Nurse Practitioner

## 2019-01-20 ENCOUNTER — Other Ambulatory Visit: Payer: Self-pay

## 2019-01-20 ENCOUNTER — Inpatient Hospital Stay (HOSPITAL_BASED_OUTPATIENT_CLINIC_OR_DEPARTMENT_OTHER): Payer: Medicare Other | Admitting: Nurse Practitioner

## 2019-01-20 ENCOUNTER — Inpatient Hospital Stay: Payer: Medicare Other | Attending: Nurse Practitioner

## 2019-01-20 VITALS — BP 141/79 | HR 115 | Temp 98.2°F | Resp 17 | Ht 66.0 in | Wt 171.7 lb

## 2019-01-20 DIAGNOSIS — R5383 Other fatigue: Secondary | ICD-10-CM | POA: Insufficient documentation

## 2019-01-20 DIAGNOSIS — Z170 Estrogen receptor positive status [ER+]: Secondary | ICD-10-CM

## 2019-01-20 DIAGNOSIS — Z79810 Long term (current) use of selective estrogen receptor modulators (SERMs): Secondary | ICD-10-CM | POA: Insufficient documentation

## 2019-01-20 DIAGNOSIS — Z90710 Acquired absence of both cervix and uterus: Secondary | ICD-10-CM | POA: Diagnosis not present

## 2019-01-20 DIAGNOSIS — C50412 Malignant neoplasm of upper-outer quadrant of left female breast: Secondary | ICD-10-CM | POA: Insufficient documentation

## 2019-01-20 LAB — CBC WITH DIFFERENTIAL/PLATELET
Abs Immature Granulocytes: 0.02 10*3/uL (ref 0.00–0.07)
Basophils Absolute: 0.1 10*3/uL (ref 0.0–0.1)
Basophils Relative: 1 %
Eosinophils Absolute: 0.2 10*3/uL (ref 0.0–0.5)
Eosinophils Relative: 3 %
HCT: 37.4 % (ref 36.0–46.0)
Hemoglobin: 12.5 g/dL (ref 12.0–15.0)
Immature Granulocytes: 0 %
Lymphocytes Relative: 30 %
Lymphs Abs: 1.8 10*3/uL (ref 0.7–4.0)
MCH: 32.5 pg (ref 26.0–34.0)
MCHC: 33.4 g/dL (ref 30.0–36.0)
MCV: 97.1 fL (ref 80.0–100.0)
Monocytes Absolute: 0.3 10*3/uL (ref 0.1–1.0)
Monocytes Relative: 5 %
Neutro Abs: 3.7 10*3/uL (ref 1.7–7.7)
Neutrophils Relative %: 61 %
Platelets: 282 10*3/uL (ref 150–400)
RBC: 3.85 MIL/uL — ABNORMAL LOW (ref 3.87–5.11)
RDW: 15.3 % (ref 11.5–15.5)
WBC: 6.1 10*3/uL (ref 4.0–10.5)
nRBC: 0 % (ref 0.0–0.2)

## 2019-01-20 LAB — COMPREHENSIVE METABOLIC PANEL
ALT: 26 U/L (ref 0–44)
AST: 12 U/L — ABNORMAL LOW (ref 15–41)
Albumin: 3.5 g/dL (ref 3.5–5.0)
Alkaline Phosphatase: 57 U/L (ref 38–126)
Anion gap: 10 (ref 5–15)
BUN: 19 mg/dL (ref 8–23)
CO2: 23 mmol/L (ref 22–32)
Calcium: 8.2 mg/dL — ABNORMAL LOW (ref 8.9–10.3)
Chloride: 108 mmol/L (ref 98–111)
Creatinine, Ser: 0.76 mg/dL (ref 0.44–1.00)
GFR calc Af Amer: >60 mL/min (ref >60)
GFR calc non Af Amer: >60 mL/min (ref >60)
Glucose, Bld: 126 mg/dL — ABNORMAL HIGH (ref 70–99)
Potassium: 3.5 mmol/L (ref 3.5–5.1)
Sodium: 141 mmol/L (ref 135–145)
Total Bilirubin: 0.9 mg/dL (ref 0.3–1.2)
Total Protein: 7.1 g/dL (ref 6.5–8.1)

## 2019-01-20 MED ORDER — TAMOXIFEN CITRATE 20 MG PO TABS: 20.0000 mg | ORAL_TABLET | Freq: Every day | ORAL | 2 refills | Status: DC

## 2019-01-21 ENCOUNTER — Telehealth: Payer: Self-pay | Admitting: Nurse Practitioner

## 2019-01-21 NOTE — Telephone Encounter (Signed)
Scheduled appt per 1/11 los.  Spoke with pt and she is aware of the appt date and time.

## 2019-01-30 ENCOUNTER — Ambulatory Visit: Payer: Medicare Other | Attending: Internal Medicine

## 2019-01-30 DIAGNOSIS — Z23 Encounter for immunization: Secondary | ICD-10-CM | POA: Insufficient documentation

## 2019-01-30 NOTE — Progress Notes (Signed)
   Covid-19 Vaccination Clinic  Name:  Laurie Calhoun    MRN: [id number] DOB: 1955/02/13  01/30/2019  [name] was observed post Covid-19 immunization for 15 minutes without incidence. She was provided with Vaccine Information Sheet and instruction to access the V-Safe system.   [name] was instructed to call 911 with any severe reactions post vaccine: Marland Kitchen Difficulty breathing  . Swelling of your face and throat  . A fast heartbeat  . A bad rash all over your body  . Dizziness and weakness    Immunizations Administered    Name Date Dose VIS Date Route   Pfizer COVID-19 Vaccine 01/30/2019  9:27 AM 0.3 mL 12/20/2018 Intramuscular   Manufacturer: [place]   Lot: [id number]   [place]: [id number]

## 2019-02-10 ENCOUNTER — Telehealth: Payer: Self-pay

## 2019-02-10 NOTE — Telephone Encounter (Signed)
Pt ca//ed with c/o pain in her left breast incision site and axilla that started 1 week ago but is less in intensity now. The pain is when she removes her bra or rolls over in bed.  She also sees a small yellow discoloration at this site.  She denies feeling any lump and there is no drainage from the nipple.  Per Dr. Burr Medico pt to monitor, if not improving by the end of the week patient is to call us.  Laurie Calhoun verbalized understanding

## 2019-02-20 ENCOUNTER — Ambulatory Visit: Payer: Medicare Other | Attending: Internal Medicine

## 2019-02-20 DIAGNOSIS — Z23 Encounter for immunization: Secondary | ICD-10-CM | POA: Insufficient documentation

## 2019-02-20 NOTE — Progress Notes (Signed)
   Covid-19 Vaccination Clinic  Name:  Laurie Calhoun    MRN: [id number] DOB: Aug 19, 1955  02/20/2019  Laurie Calhoun was observed post Covid-19 immunization for 15 minutes without incidence. She was provided with Vaccine Information Sheet and instruction to access the V-Safe system.   Laurie Calhoun was instructed to call 911 with any severe reactions post vaccine: Marland Kitchen Difficulty breathing  . Swelling of your face and throat  . A fast heartbeat  . A bad rash all over your body  . Dizziness and weakness    Immunizations Administered    Name Date Dose VIS Date Route   Pfizer COVID-19 Vaccine 02/20/2019  8:15 AM 0.3 mL 12/20/2018 Intramuscular   Manufacturer: [place]   Lot: [id number]   [place]: [id number]

## 2019-03-12 ENCOUNTER — Telehealth: Payer: Self-pay | Admitting: Nurse Practitioner

## 2019-03-12 ENCOUNTER — Telehealth: Payer: Self-pay

## 2019-03-12 MED ORDER — ANASTROZOLE 1 MG PO TABS: 1.0000 mg | ORAL_TABLET | Freq: Every day | ORAL | 1 refills | Status: DC

## 2019-03-12 NOTE — Telephone Encounter (Signed)
I called Ms. Klenke back to discuss her joint pain. She has progressive pain in knees, back, and hips. She has not changed anything about her normal routine, no fall or injury. She attributes to tamoxifen. She has to take tylenol which is only partially effective, but does not want to have to continue taking pain meds. The pain is affecting her activity and quality of life. She is asking for alternative to tamoxifen. She tried letrozole in the past, stopped due to arthralgia. At first I recommended exemestane but due to often high copay, she prefers to try anastrozole first. She will stop tamoxifen now, then start anastrozole in a few weeks. F/u in 07/2019 as planned. She knows to call sooner if pain worsens or fails to improve. She understands and appreciates the call.   Cira Rue, NP

## 2019-03-12 NOTE — Telephone Encounter (Signed)
Patient left a VM stating she would like to change her tamoxifen prescription to another antiestrogen due to constant joint pain. Notified Lacie Burton-NP who stated she would look into other options and either call the patient herself or let staff know what to call the patient and tell her.

## 2019-04-28 ENCOUNTER — Telehealth: Payer: Self-pay

## 2019-04-28 NOTE — Telephone Encounter (Signed)
I'll call her back tomorrow to discuss,  Thanks, Regan Rakers

## 2019-04-28 NOTE — Telephone Encounter (Signed)
Laurie Calhoun left vm stating that the change to anastrozole has not improved the arthralgias. The pain awakens her at night.  She wants to stop antiestrogen therapy altogether.

## 2019-04-29 ENCOUNTER — Telehealth: Payer: Self-pay | Admitting: Nurse Practitioner

## 2019-04-29 NOTE — Telephone Encounter (Signed)
Ms. Forren left VM yesterday stating she wants to stop anti-estrogen therapy. I called her back to discuss. She is currently on anastrozole, previously tried letrozole and tamoxifen. Taking the medication makes her "feel very old" meaning when she gets up or does activity she has back, hip, and shoulder pain that "hurts very bad." She tries to avoid taking pain meds when possible. Pain wakes her up at night and has difficulty going back to sleep. She wants to stop it.   We reviewed her oncotype score which is 8, low risk, predicting 10 year risk of distant recurrence at 6% with tamoxifen; without anti-estrogen that risk is higher. She has not tried exemestane yet but at this point she prefers to stop endocrine therapy.   We will change her f/u in 07/2019 to in-person visit with breast exam. If she is feeling better she might consider trying an alternative agent but for now will remain off therapy, will proceed with surveillance. She understands the plan and her risks and appreciates the call.   Cira Rue, NP  04/29/19

## 2019-06-27 ENCOUNTER — Telehealth: Payer: Self-pay | Admitting: Nurse Practitioner

## 2019-06-27 NOTE — Telephone Encounter (Signed)
Returned call per 6/17 vmail - no answer and vmail full.

## 2019-07-09 ENCOUNTER — Telehealth: Payer: Self-pay | Admitting: Nurse Practitioner

## 2019-07-09 NOTE — Telephone Encounter (Signed)
R/s appt from 7/12 to 7/13. Provider on PAL

## 2019-07-21 ENCOUNTER — Other Ambulatory Visit: Payer: Medicare Other

## 2019-07-21 ENCOUNTER — Ambulatory Visit: Payer: Medicare Other | Admitting: Nurse Practitioner

## 2019-07-22 ENCOUNTER — Inpatient Hospital Stay: Payer: Medicare Other | Admitting: Nurse Practitioner

## 2019-07-22 ENCOUNTER — Other Ambulatory Visit: Payer: Self-pay

## 2019-07-22 ENCOUNTER — Telehealth: Payer: Self-pay | Admitting: Nurse Practitioner

## 2019-07-22 ENCOUNTER — Inpatient Hospital Stay: Payer: Medicare Other | Attending: Nurse Practitioner

## 2019-07-22 ENCOUNTER — Encounter: Payer: Self-pay | Admitting: Nurse Practitioner

## 2019-07-22 VITALS — BP 119/82 | HR 79 | Temp 97.7°F | Resp 18 | Ht 66.0 in | Wt 168.2 lb

## 2019-07-22 DIAGNOSIS — Z170 Estrogen receptor positive status [ER+]: Secondary | ICD-10-CM

## 2019-07-22 DIAGNOSIS — C50412 Malignant neoplasm of upper-outer quadrant of left female breast: Secondary | ICD-10-CM

## 2019-07-22 DIAGNOSIS — Z79810 Long term (current) use of selective estrogen receptor modulators (SERMs): Secondary | ICD-10-CM | POA: Insufficient documentation

## 2019-07-22 DIAGNOSIS — Z79899 Other long term (current) drug therapy: Secondary | ICD-10-CM | POA: Diagnosis not present

## 2019-07-22 DIAGNOSIS — K219 Gastro-esophageal reflux disease without esophagitis: Secondary | ICD-10-CM | POA: Insufficient documentation

## 2019-07-22 DIAGNOSIS — Z87442 Personal history of urinary calculi: Secondary | ICD-10-CM | POA: Insufficient documentation

## 2019-07-22 LAB — COMPREHENSIVE METABOLIC PANEL
ALT: 19 U/L (ref 0–44)
AST: 13 U/L — ABNORMAL LOW (ref 15–41)
Albumin: 3.8 g/dL (ref 3.5–5.0)
Alkaline Phosphatase: 71 U/L (ref 38–126)
Anion gap: 9 (ref 5–15)
BUN: 21 mg/dL (ref 8–23)
CO2: 25 mmol/L (ref 22–32)
Calcium: 9.4 mg/dL (ref 8.9–10.3)
Chloride: 108 mmol/L (ref 98–111)
Creatinine, Ser: 0.78 mg/dL (ref 0.44–1.00)
GFR calc Af Amer: >60 mL/min (ref >60)
GFR calc non Af Amer: >60 mL/min (ref >60)
Glucose, Bld: 113 mg/dL — ABNORMAL HIGH (ref 70–99)
Potassium: 3.9 mmol/L (ref 3.5–5.1)
Sodium: 142 mmol/L (ref 135–145)
Total Bilirubin: 0.7 mg/dL (ref 0.3–1.2)
Total Protein: 7.3 g/dL (ref 6.5–8.1)

## 2019-07-22 LAB — CBC WITH DIFFERENTIAL/PLATELET
Abs Immature Granulocytes: 0.01 10*3/uL (ref 0.00–0.07)
Basophils Absolute: 0 10*3/uL (ref 0.0–0.1)
Basophils Relative: 1 %
Eosinophils Absolute: 0.1 10*3/uL (ref 0.0–0.5)
Eosinophils Relative: 2 %
HCT: 38.5 % (ref 36.0–46.0)
Hemoglobin: 13 g/dL (ref 12.0–15.0)
Immature Granulocytes: 0 %
Lymphocytes Relative: 32 %
Lymphs Abs: 1.6 10*3/uL (ref 0.7–4.0)
MCH: 31.4 pg (ref 26.0–34.0)
MCHC: 33.8 g/dL (ref 30.0–36.0)
MCV: 93 fL (ref 80.0–100.0)
Monocytes Absolute: 0.5 10*3/uL (ref 0.1–1.0)
Monocytes Relative: 9 %
Neutro Abs: 2.9 10*3/uL (ref 1.7–7.7)
Neutrophils Relative %: 56 %
Platelets: 260 10*3/uL (ref 150–400)
RBC: 4.14 MIL/uL (ref 3.87–5.11)
RDW: 13.5 % (ref 11.5–15.5)
WBC: 5.1 10*3/uL (ref 4.0–10.5)
nRBC: 0 % (ref 0.0–0.2)

## 2019-07-22 NOTE — Telephone Encounter (Signed)
R/s per pt request. appts moved to 02/02/20

## 2019-07-22 NOTE — Progress Notes (Signed)
[place]   [phone] Fax:(336) [phone]   Clinic Follow up Note   Patient Care Team: Harlan Stains, MD as PCP - General (Family Medicine) Kyung Rudd, MD as Consulting Physician (Radiation Oncology) Rolm Bookbinder, MD as Consulting Physician (General Surgery) Delice Bison, Charlestine Massed, NP as Nurse Practitioner (Hematology and Oncology) Truitt Merle, MD as Consulting Physician (Hematology) 07/22/2019  CHIEF COMPLAINT: Follow-up left breast cancer  SUMMARY OF ONCOLOGIC HISTORY: Oncology History Overview Note  Cancer Staging Malignant neoplasm of upper-outer quadrant of left female breast [facility]) Staging form: Breast, AJCC 7th Edition - Clinical stage from 12/15/2015: Stage Unknown ([place], N0, M0) - Unsigned Staging form: Breast, AJCC 8th Edition - Pathologic stage from 03/05/2016: Stage IA (pT1c, pN0, cM0, G1, ER: Positive, PR: Positive, HER2: Negative, Oncotype DX score: 8) - Signed by Truitt Merle, MD on 03/05/2016     Malignant neoplasm of upper-outer quadrant of left female breast ([place])  11/30/2015 Mammogram   Diagnostic mammogram of left breast showed persistent left breast architectural distortion in the upper outer quadrant, no responding findings on ultrasound, left axilla was negative on ultrasound.   12/01/2015 Initial Diagnosis   Malignant neoplasm of upper-outer quadrant of left female breast ([place])   12/01/2015 Initial Biopsy   Left breast core needle biopsy in the upper outer quadrant showed invasive ductal carcinoma, G1, and atypical ductal hyperplasia.   12/01/2015 Receptors her2   ER 90% positive, PR 100% positive, strong staining, HER-2 negative, Ki-67 5%    12/21/2015 Surgery   Left breast lumpectomy and sentinel lymph node biopsy.   12/21/2015 Pathology Results   Left breast lumpectomy showed invasive ductal carcinoma with calcifications, grade 1, 1.4 cm, low-grade DCIS, surgical margins were negative (greater than 0.2cm). 2 sentinel  lymph nodes were negative. No lymphovascular invasion.   12/21/2015 Oncotype testing   Recurrence score 8, which predicts 10 year risk of distant recurrence 6% with tamoxifen.   01/31/2016 - 02/25/2016 Radiation Therapy   1. The Left breast was treated to 42.5 Gy in 17 fractions at 2.5 Gy per fraction. 2. The Left breast was boosted to 7.5 in 3 fractions at 2.5 Gy per fraction.   03/2016 -  Anti-estrogen oral therapy   adjuvant letrozole 2.83m daily, changed to Tamoxifen due to arthralgia in June 2018   04/11/2016 Imaging   04/11/2016 DEXA ASSESSMENT: The BMD measured at Femur Neck Right is 0.914 g/cm2 with a T-score of -0.9.    12/22/2016 Mammogram   12/22/2016 Mammogram IMPRESSION: 1. No mammographic evidence of malignancy involving either breast. 2. Expected post lumpectomy and post radiation changes involving left breast. 3. Expected post surgical changes related to the prior excisional biopsy involving the upper outer quadrant of the right breast.   12/25/2017 Mammogram   IMPRESSION: No evidence of malignancy within either breast. Stable postsurgical changes within the LEFT breast.   RECOMMENDATION: Bilateral diagnostic mammogram in 1 year.   01/15/2019 Mammogram   IMPRESSION: Expected post lumpectomy changes in the left breast. No mammographic evidence of malignancy in the bilateral breasts.       CURRENT THERAPY: Surveillance, stopped antiestrogen therapy in April/2021 due to side effects  INTERVAL HISTORY: Ms. HBaumgarnerreturns for follow-up as scheduled.  She was last seen in clinic on 01/20/2019 when she was on tamoxifen.  In March/2021 we spoke on the phone about her progressive joint pain in knees, back, hips which was affecting her activity and quality of life.  She was switched to anastrozole at that time.  We spoke again on 04/29/2019 and we ultimately decided to discontinue antiestrogen therapy due to arthralgia.  Her joint pain has resolved in the interval.  She feels  well, healthy overall.  She has started doing water aerobics, feels sore muscles from that but otherwise denies new bone or joint pain.  Appetite and energy are adequate.  Denies concerns in her breast such as new lump, mass, nipple discharge, or skin change.  Breasts are less sensitive overall.  Denies recent fever or infection, changes in her bowel function, any bleeding.  She is due for colonoscopy.   MEDICAL HISTORY:  Past Medical History:  Diagnosis Date  . Allergy   . Breast cancer ([place]) 12/01/2015   Left Breast  . GERD (gastroesophageal reflux disease)   . 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 History of kidney stones   . Malignant neoplasm of upper-outer quadrant of left female breast ([place]) 12/07/2015  . Personal history of radiation therapy     SURGICAL HISTORY: Past Surgical History:  Procedure Laterality Date  . ABDOMINAL HYSTERECTOMY    . BREAST BIOPSY Left 12/01/2015  . BREAST LUMPECTOMY Left 12/21/2015   2017  . BREAST LUMPECTOMY WITH RADIOACTIVE SEED AND SENTINEL LYMPH NODE BIOPSY Left 12/21/2015   Procedure: LEFT BREAST LUMPECTOMY WITH RADIOACTIVE SEED AND SENTINEL LYMPH NODE BIOPSY;  Surgeon: Rolm Bookbinder, MD;  Location: [place];  Service: General;  Laterality: Left;  . BREAST SURGERY     breast lumpectomy ?2000   . COLONOSCOPY    . PILONIDAL CYST EXCISION      I have reviewed the social history and family history with the patient and they are unchanged from previous note.  ALLERGIES:  is allergic to penicillins.  MEDICATIONS:  Current Outpatient Medications  Medication Sig Dispense Refill  . APPLE CIDER VINEGAR PO Take by mouth.    . Multiple Vitamins-Minerals (CENTRUM ADULTS PO) Take by mouth.    . Vitamin D, Ergocalciferol, (DRISDOL) 50000 units CAPS capsule Take 50,000 Units by mouth every Monday.     . zolpidem (AMBIEN) 10 MG tablet Take 10 mg by mouth at bedtime as needed.    . diphenhydramine-acetaminophen (TYLENOL PM) 25-500 MG TABS tablet Take 2  tablets by mouth at bedtime as needed (for sleep.).    Marland Kitchen potassium chloride SA (KLOR-CON) 20 MEQ tablet Take 1 tablet (20 mEq total) by mouth 2 (two) times daily for 5 days. 10 tablet 0   No current facility-administered medications for this visit.    PHYSICAL EXAMINATION: ECOG PERFORMANCE STATUS: 0 - Asymptomatic  Vitals:   07/22/19 0857  BP: 119/82  Pulse: 79  Resp: 18  Temp: 97.7 F (36.5 C)  SpO2: 98%   Filed Weights   07/22/19 0857  Weight: 168 lb 3.2 oz (76.3 kg)    GENERAL:alert, no distress and comfortable SKIN: No rash to exposed skin EYES: sclera clear NECK: Without mass LYMPH:  no palpable cervical, supraclavicular, or axillary lymphadenopathy LUNGS: normal breathing effort HEART:  no lower extremity edema NEURO: alert & oriented x 3 with fluent speech, no focal motor/sensory deficits Breast: Breasts appear symmetric without nipple discharge.  Left breast s/p lumpectomy.  Incision completely healed.  No palpable mass in either breast or axilla that I could appreciate.  LABORATORY DATA:  I have reviewed the data as listed CBC Latest Ref Rng & Units 07/22/2019 01/20/2019 01/01/2019  WBC 4.0 - 10.5 K/uL 5.1 6.1 8.2  Hemoglobin 12.0 - 15.0 g/dL 13.0 12.5 11.8(L)  Hematocrit 36 - 46 % 38.5 37.4 34.7(L)  Platelets 150 - 400 K/uL 260 282 94(L)     CMP Latest Ref Rng & Units 07/22/2019 01/20/2019 01/01/2019  Glucose 70 - 99 mg/dL 113(H) 126(H) 130(H)  BUN 8 - 23 mg/dL 21 19 12   Creatinine 0.44 - 1.00 mg/dL 0.78 0.76 0.80  Sodium 135 - 145 mmol/L 142 141 136  Potassium 3.5 - 5.1 mmol/L 3.9 3.5 2.6(LL)  Chloride 98 - 111 mmol/L 108 108 104  CO2 22 - 32 mmol/L 25 23 23   Calcium 8.9 - 10.3 mg/dL 9.4 8.2(L) 8.1(L)  Total Protein 6.5 - 8.1 g/dL 7.3 7.1 6.3(L)  Total Bilirubin 0.3 - 1.2 mg/dL 0.7 0.9 0.7  Alkaline Phos 38 - 126 U/L 71 57 124  AST 15 - 41 U/L 13(L) 12(L) 60(H)  ALT 0 - 44 U/L 19 26 153(H)      RADIOGRAPHIC STUDIES: I have personally reviewed the  radiological images as listed and agreed with the findings in the report. No results found.   ASSESSMENT & PLAN: Laurie Calhoun a 64 y.o.femalewith   1. Malignant neoplasm of upper-outer quadrant of left breast, invasive ductal carcinoma, grade 1,pT1cN0M0, ER+/PR+/HER2- -She was diagnosed in 11/2015. She is s/p left breast lumpectomy and adjuvant radiation. Due to low risk oncotype score of 8, adjuvant chemotherapy was not recommended.  -She started letrozole in 03/2016 but due to arthralgia weswitchedher to Tamoxifen in 06/2016, arthralgia resolved then.  However she developed recurrent joint pain affecting activity and quality of life, tamoxifen was switched to anastrozole but ultimately discontinued in 04/2019 due to arthralgia -On surveillance  -Last mammogram 01/2019 negative  3. Bone Health -04/30/2018DEXAwas normal with T score of -0.9at right femur neck. -Continue calcium and vitamin D and weightbearing exercises -We will repeat vitamin D level at next visit  Disposition:  Laurie Calhoun is clinically doing well.  Breast exam is benign.  CBC and CMP are unremarkable.  No clinical concern for recurrence.  Her annual mammogram in January was negative.  She will be due again in 01/2020.  We will continue breast cancer surveillance.  She is currently off antiestrogen therapy.  We discussed the role of screening breast MRI as an additional tool to mammogram, she declined for now.   We reviewed S/SX of recurrence including new bone or joint pain, abdominal pain, unexplained fatigue or weight loss.  I encouraged her to remain healthy and active and keep up with all other age-appropriate cancer screenings and health maintenance.  She will call her previous GI to schedule a screening colonoscopy.  She will return for routine surveillance lab and follow-up in 6 months.   Orders Placed This Encounter  Procedures  . MM DIAG BREAST TOMO BILATERAL    Standing Status:   Future     Standing Expiration Date:   07/21/2020    Order Specific Question:   Reason for Exam (SYMPTOM  OR DIAGNOSIS REQUIRED)    Answer:   History of left breast cancer s/p lumpectomy and radiation    Order Specific Question:   Preferred imaging location?    Answer:   [facility]  . Vitamin D 25 hydroxy    Standing Status:   Standing    Number of Occurrences:   1    Standing Expiration Date:   07/21/2020   All questions were answered. The patient knows to call the clinic with any problems, questions or concerns. No barriers to learning was detected.  Alla Feeling, NP 07/22/19

## 2019-07-23 ENCOUNTER — Telehealth: Payer: Self-pay | Admitting: Nurse Practitioner

## 2019-07-23 NOTE — Telephone Encounter (Signed)
appts already scheduled. Per 7/13 los

## 2019-09-21 ENCOUNTER — Other Ambulatory Visit: Payer: Self-pay | Admitting: Nurse Practitioner

## 2019-10-02 NOTE — Telephone Encounter (Signed)
For your approval

## 2020-01-21 ENCOUNTER — Other Ambulatory Visit: Payer: Medicare Other

## 2020-01-21 ENCOUNTER — Ambulatory Visit: Payer: Medicare Other | Admitting: Hematology

## 2020-01-29 ENCOUNTER — Other Ambulatory Visit: Payer: Self-pay

## 2020-01-29 ENCOUNTER — Ambulatory Visit
Admission: RE | Admit: 2020-01-29 | Discharge: 2020-01-29 | Disposition: A | Discharge status: Home / Self Care | Payer: Medicare Other | Source: Ambulatory Visit | Attending: Nurse Practitioner | Admitting: Nurse Practitioner

## 2020-01-29 DIAGNOSIS — Z170 Estrogen receptor positive status [ER+]: Secondary | ICD-10-CM

## 2020-01-29 DIAGNOSIS — C50412 Malignant neoplasm of upper-outer quadrant of left female breast: Secondary | ICD-10-CM

## 2020-01-30 NOTE — Progress Notes (Signed)
[place]   Telephone:(336) [phone] Fax:(336) [phone]   Clinic Follow up Note   Patient Care Team: Harlan Stains, MD as PCP - General (Family Medicine) Kyung Rudd, MD as Consulting Physician (Radiation Oncology) Rolm Bookbinder, MD as Consulting Physician (General Surgery) Delice Bison, Charlestine Massed, NP as Nurse Practitioner (Hematology and Oncology) Truitt Merle, MD as Consulting Physician (Hematology)  Date of Service:  02/02/2020  CHIEF COMPLAINT: F/u of left breast cancer   SUMMARY OF ONCOLOGIC HISTORY: Oncology History Overview Note  Cancer Staging Malignant neoplasm of upper-outer quadrant of left female breast [facility] Staging form: Breast, AJCC 7th Edition - Clinical stage from 12/15/2015: Stage Unknown ([place], N0, M0) - Unsigned Staging form: Breast, AJCC 8th Edition - Pathologic stage from 03/05/2016: Stage IA (pT1c, pN0, cM0, G1, ER: Positive, PR: Positive, HER2: Negative, Oncotype DX score: 8) - Signed by Truitt Merle, MD on 03/05/2016     Malignant neoplasm of upper-outer quadrant of left female breast ([place])  11/30/2015 Mammogram   Diagnostic mammogram of left breast showed persistent left breast architectural distortion in the upper outer quadrant, no responding findings on ultrasound, left axilla was negative on ultrasound.   12/01/2015 Initial Diagnosis   Malignant neoplasm of upper-outer quadrant of left female breast ([place])   12/01/2015 Initial Biopsy   Left breast core needle biopsy in the upper outer quadrant showed invasive ductal carcinoma, G1, and atypical ductal hyperplasia.   12/01/2015 Receptors her2   ER 90% positive, PR 100% positive, strong staining, HER-2 negative, Ki-67 5%    12/21/2015 Surgery   Left breast lumpectomy and sentinel lymph node biopsy.   12/21/2015 Pathology Results   Left breast lumpectomy showed invasive ductal carcinoma with calcifications, grade 1, 1.4 cm, low-grade DCIS, surgical margins were negative (greater than  0.2cm). 2 sentinel lymph nodes were negative. No lymphovascular invasion.   12/21/2015 Oncotype testing   Recurrence score 8, which predicts 10 year risk of distant recurrence 6% with tamoxifen.   01/31/2016 - 02/25/2016 Radiation Therapy   1. The Left breast was treated to 42.5 Gy in 17 fractions at 2.5 Gy per fraction. 2. The Left breast was boosted to 7.5 in 3 fractions at 2.5 Gy per fraction.   03/2016 - 04/2019 Anti-estrogen oral therapy   Letrozole 2.5 mg started 03/2016, changed to Tamoxifen in 06/2016 due to arthralgia. stopped antiestrogen therapy in April/2021 due to side effects.    04/11/2016 Imaging   04/11/2016 DEXA ASSESSMENT: The BMD measured at Femur Neck Right is 0.914 g/cm2 with a T-score of -0.9.    12/22/2016 Mammogram   12/22/2016 Mammogram IMPRESSION: 1. No mammographic evidence of malignancy involving either breast. 2. Expected post lumpectomy and post radiation changes involving left breast. 3. Expected post surgical changes related to the prior excisional biopsy involving the upper outer quadrant of the right breast.   12/25/2017 Mammogram   IMPRESSION: No evidence of malignancy within either breast. Stable postsurgical changes within the LEFT breast.   RECOMMENDATION: Bilateral diagnostic mammogram in 1 year.   01/15/2019 Mammogram   IMPRESSION: Expected post lumpectomy changes in the left breast. No mammographic evidence of malignancy in the bilateral breasts.        CURRENT THERAPY:  Surveillance    INTERVAL HISTORY:  Laurie Calhoun is here for a follow up of left breast cancer. She was last seen by me in 02/2017 and seen by NP Lacie in interim, last 6 months ago. She presents to the clinic alone. She notes she is doing well.  She notes she has tried to lose weight and since weight loss she feels better. She notes she is not at her goal weight yet. She denies any pain especially since stopping AI. She will occasionally have left knee pain. She notes  she has been on high dose Vit D for some time.     REVIEW OF SYSTEMS:   Constitutional: Denies fevers, chills or abnormal weight loss Eyes: Denies blurriness of vision Ears, nose, mouth, throat, and face: Denies mucositis or sore throat Respiratory: Denies cough, dyspnea or wheezes Cardiovascular: Denies palpitation, chest discomfort or lower extremity swelling Gastrointestinal:  Denies nausea, heartburn or change in bowel habits Skin: Denies abnormal skin rashes Lymphatics: Denies new lymphadenopathy or easy bruising Neurological:Denies numbness, tingling or new weaknesses Behavioral/Psych: Mood is stable, no new changes  All other systems were reviewed with the patient and are negative.  MEDICAL HISTORY:  Past Medical History:  Diagnosis Date  . Allergy   . Breast cancer ([place]) 12/01/2015   Left Breast  . GERD (gastroesophageal reflux disease)   . 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 History of kidney stones   . Malignant neoplasm of upper-outer quadrant of left female breast ([place]) 12/07/2015  . Personal history of radiation therapy     SURGICAL HISTORY: Past Surgical History:  Procedure Laterality Date  . ABDOMINAL HYSTERECTOMY    . BREAST BIOPSY Left 12/01/2015  . BREAST LUMPECTOMY Left 12/21/2015   2017  . BREAST LUMPECTOMY WITH RADIOACTIVE SEED AND SENTINEL LYMPH NODE BIOPSY Left 12/21/2015   Procedure: LEFT BREAST LUMPECTOMY WITH RADIOACTIVE SEED AND SENTINEL LYMPH NODE BIOPSY;  Surgeon: Rolm Bookbinder, MD;  Location: [place];  Service: General;  Laterality: Left;  . BREAST SURGERY     breast lumpectomy ?2000   . COLONOSCOPY    . PILONIDAL CYST EXCISION      I have reviewed the social history and family history with the patient and they are unchanged from previous note.  ALLERGIES:  is allergic to penicillins.  MEDICATIONS:  Current Outpatient Medications  Medication Sig Dispense Refill  . APPLE CIDER VINEGAR PO Take by mouth.    .  diphenhydramine-acetaminophen (TYLENOL PM) 25-500 MG TABS tablet Take 2 tablets by mouth at bedtime as needed (for sleep.).    Marland Kitchen Multiple Vitamins-Minerals (CENTRUM ADULTS PO) Take by mouth.    . potassium chloride SA (KLOR-CON) 20 MEQ tablet Take 1 tablet (20 mEq total) by mouth 2 (two) times daily for 5 days. 10 tablet 0  . Vitamin D, Ergocalciferol, (DRISDOL) 50000 units CAPS capsule Take 50,000 Units by mouth every Monday.     . zolpidem (AMBIEN) 10 MG tablet Take 10 mg by mouth at bedtime as needed.     No current facility-administered medications for this visit.    PHYSICAL EXAMINATION: ECOG PERFORMANCE STATUS: 0 - Asymptomatic  Vitals:   02/02/20 1127  BP: 131/86  Pulse: 94  Resp: 18  Temp: (!) 97.4 F (36.3 C)  SpO2: 99%   Filed Weights   02/02/20 1127  Weight: 157 lb 3.2 oz (71.3 kg)    GENERAL:alert, no distress and comfortable SKIN: skin color, texture, turgor are normal, no rashes or significant lesions EYES: normal, Conjunctiva are pink and non-injected, sclera clear  NECK: supple, thyroid normal size, non-tender, without nodularity LYMPH:  no palpable lymphadenopathy in the cervical, axillary  LUNGS: clear to auscultation and percussion with normal breathing effort HEART: regular rate & rhythm and no murmurs and no lower  extremity edema ABDOMEN:abdomen soft, non-tender and normal bowel sounds Musculoskeletal:no cyanosis of digits and no clubbing  NEURO: alert & oriented x 3 with fluent speech, no focal motor/sensory deficits BREAST:S/p left lumpectomy: Surgical incision healed well. No palpable mass, nodules or adenopathy bilaterally. Breast exam benign. (+) Yellowish crackling around her left nipple, benign.   LABORATORY DATA:  I have reviewed the data as listed CBC Latest Ref Rng & Units 02/02/2020 07/22/2019 01/20/2019  WBC 4.0 - 10.5 K/uL 6.3 5.1 6.1  Hemoglobin 12.0 - 15.0 g/dL 13.4 13.0 12.5  Hematocrit 36.0 - 46.0 % 38.6 38.5 37.4  Platelets 150 - 400  K/uL 266 260 282     CMP Latest Ref Rng & Units 02/02/2020 07/22/2019 01/20/2019  Glucose 70 - 99 mg/dL 111(H) 113(H) 126(H)  BUN 8 - 23 mg/dL 16 21 19   Creatinine 0.44 - 1.00 mg/dL 0.81 0.78 0.76  Sodium 135 - 145 mmol/L 138 142 141  Potassium 3.5 - 5.1 mmol/L 3.9 3.9 3.5  Chloride 98 - 111 mmol/L 106 108 108  CO2 22 - 32 mmol/L 24 25 23   Calcium 8.9 - 10.3 mg/dL 9.2 9.4 8.2(L)  Total Protein 6.5 - 8.1 g/dL 7.6 7.3 7.1  Total Bilirubin 0.3 - 1.2 mg/dL 1.2 0.7 0.9  Alkaline Phos 38 - 126 U/L 81 71 57  AST 15 - 41 U/L 13(L) 13(L) 12(L)  ALT 0 - 44 U/L 18 19 26       RADIOGRAPHIC STUDIES: I have personally reviewed the radiological images as listed and agreed with the findings in the report. No results found.   ASSESSMENT & PLAN:  Wei Newbrough is a 65 y.o. female with   1. Upper-outer quadrant left breast with invasive ductal carcinoma, grade 1, pT1cN0M0, ER+/PR+/HER2- -She was diagnosed in 11/2015. She is s/p left breast lumpectomy and adjuvant radiation. She started letrozole in 03/2016 but due to arthralgia we switched her to Tamoxifen in 06/2016. She ultimately stopped in 04/2019 due to side effects. She is currently on surveillance.  -She is clinically doing well. Lab reviewed, her CBC and CMP are within normal limits. Her physical exam and her 01/11/20 mammogram were unremarkable. There is no clinical concern for recurrence. -Continue Surveillance. Next mammogram in 01/2021. I her to watch for concerning symptoms of recurrence with RUQ, bone pain in spine, hip, significant weight loss.  -F/u in March 2023 then once a year. She will F/u with her PCP in interim.   2. Bone Health -05/08/2016 DEXA was normal with T score of -0.9at right femur neck.  -Continue calcium and vitamin D. She notes she has been on high dose Vit D for a while, her level is pending today (02/02/20) -Repeat DEXA in 01/2021.   3.Arthralgia -Secondary to Letrozole and Tamoxifen.   -Has resolved off  antiestrogen therapy.   Plan -Screening Mammogram and DEXA in 01/2021 -Lab and F/u in March 2023 -continue breast cancer surveillance    No problem-specific Assessment & Plan notes found for this encounter.   Orders Placed This Encounter  Procedures  . MM Digital Screening    Standing Status:   Future    Standing Expiration Date:   02/01/2021    Order Specific Question:   Reason for Exam (SYMPTOM  OR DIAGNOSIS REQUIRED)    Answer:   screening    Order Specific Question:   Preferred imaging location?    Answer:   [facility]  . DG Bone Density    Standing Status:   Future  Standing Expiration Date:   02/01/2021    Order Specific Question:   Reason for Exam (SYMPTOM  OR DIAGNOSIS REQUIRED)    Answer:   screening    Order Specific Question:   Preferred imaging location?    Answer:   [facility]   All questions were answered. The patient knows to call the clinic with any problems, questions or concerns. No barriers to learning was detected. The total time spent in the appointment was 25 minutes.     Truitt Merle, MD 02/02/2020   I, Joslyn Devon, am acting as scribe for Truitt Merle, MD.   I have reviewed the above documentation for accuracy and completeness, and I agree with the above.

## 2020-02-02 ENCOUNTER — Inpatient Hospital Stay: Payer: Medicare Other | Admitting: Hematology

## 2020-02-02 ENCOUNTER — Encounter: Payer: Self-pay | Admitting: Hematology

## 2020-02-02 ENCOUNTER — Other Ambulatory Visit: Payer: Self-pay

## 2020-02-02 ENCOUNTER — Inpatient Hospital Stay: Payer: Medicare Other | Attending: Nurse Practitioner

## 2020-02-02 VITALS — BP 131/86 | HR 94 | Temp 97.4°F | Resp 18 | Ht 66.0 in | Wt 157.2 lb

## 2020-02-02 DIAGNOSIS — Z923 Personal history of irradiation: Secondary | ICD-10-CM | POA: Insufficient documentation

## 2020-02-02 DIAGNOSIS — C50412 Malignant neoplasm of upper-outer quadrant of left female breast: Secondary | ICD-10-CM | POA: Diagnosis not present

## 2020-02-02 DIAGNOSIS — E2839 Other primary ovarian failure: Secondary | ICD-10-CM | POA: Diagnosis not present

## 2020-02-02 DIAGNOSIS — Z1231 Encounter for screening mammogram for malignant neoplasm of breast: Secondary | ICD-10-CM

## 2020-02-02 DIAGNOSIS — Z87442 Personal history of urinary calculi: Secondary | ICD-10-CM | POA: Insufficient documentation

## 2020-02-02 DIAGNOSIS — Z853 Personal history of malignant neoplasm of breast: Secondary | ICD-10-CM | POA: Insufficient documentation

## 2020-02-02 DIAGNOSIS — Z170 Estrogen receptor positive status [ER+]: Secondary | ICD-10-CM | POA: Diagnosis not present

## 2020-02-02 DIAGNOSIS — M2550 Pain in unspecified joint: Secondary | ICD-10-CM | POA: Diagnosis not present

## 2020-02-02 LAB — COMPREHENSIVE METABOLIC PANEL
ALT: 18 U/L (ref 0–44)
AST: 13 U/L — ABNORMAL LOW (ref 15–41)
Albumin: 3.9 g/dL (ref 3.5–5.0)
Alkaline Phosphatase: 81 U/L (ref 38–126)
Anion gap: 8 (ref 5–15)
BUN: 16 mg/dL (ref 8–23)
CO2: 24 mmol/L (ref 22–32)
Calcium: 9.2 mg/dL (ref 8.9–10.3)
Chloride: 106 mmol/L (ref 98–111)
Creatinine, Ser: 0.81 mg/dL (ref 0.44–1.00)
GFR, Estimated: >60 mL/min (ref >60)
Glucose, Bld: 111 mg/dL — ABNORMAL HIGH (ref 70–99)
Potassium: 3.9 mmol/L (ref 3.5–5.1)
Sodium: 138 mmol/L (ref 135–145)
Total Bilirubin: 1.2 mg/dL (ref 0.3–1.2)
Total Protein: 7.6 g/dL (ref 6.5–8.1)

## 2020-02-02 LAB — CBC WITH DIFFERENTIAL/PLATELET
Abs Immature Granulocytes: 0.01 10*3/uL (ref 0.00–0.07)
Basophils Absolute: 0 10*3/uL (ref 0.0–0.1)
Basophils Relative: 1 %
Eosinophils Absolute: 0.1 10*3/uL (ref 0.0–0.5)
Eosinophils Relative: 1 %
HCT: 38.6 % (ref 36.0–46.0)
Hemoglobin: 13.4 g/dL (ref 12.0–15.0)
Immature Granulocytes: 0 %
Lymphocytes Relative: 31 %
Lymphs Abs: 2 10*3/uL (ref 0.7–4.0)
MCH: 31.3 pg (ref 26.0–34.0)
MCHC: 34.7 g/dL (ref 30.0–36.0)
MCV: 90.2 fL (ref 80.0–100.0)
Monocytes Absolute: 0.5 10*3/uL (ref 0.1–1.0)
Monocytes Relative: 8 %
Neutro Abs: 3.8 10*3/uL (ref 1.7–7.7)
Neutrophils Relative %: 59 %
Platelets: 266 10*3/uL (ref 150–400)
RBC: 4.28 MIL/uL (ref 3.87–5.11)
RDW: 13.4 % (ref 11.5–15.5)
WBC: 6.3 10*3/uL (ref 4.0–10.5)
nRBC: 0 % (ref 0.0–0.2)

## 2020-02-02 LAB — VITAMIN D 25 HYDROXY (VIT D DEFICIENCY, FRACTURES): Vit D, 25-Hydroxy: 83.53 ng/mL (ref 30–100)

## 2020-03-02 DIAGNOSIS — H40012 Open angle with borderline findings, low risk, left eye: Secondary | ICD-10-CM | POA: Diagnosis not present

## 2020-03-02 DIAGNOSIS — H35373 Puckering of macula, bilateral: Secondary | ICD-10-CM | POA: Diagnosis not present

## 2020-03-02 DIAGNOSIS — H43813 Vitreous degeneration, bilateral: Secondary | ICD-10-CM | POA: Diagnosis not present

## 2020-03-02 DIAGNOSIS — H35342 Macular cyst, hole, or pseudohole, left eye: Secondary | ICD-10-CM | POA: Diagnosis not present

## 2020-05-13 DIAGNOSIS — U071 COVID-19: Secondary | ICD-10-CM | POA: Diagnosis not present

## 2020-05-28 DIAGNOSIS — H43822 Vitreomacular adhesion, left eye: Secondary | ICD-10-CM | POA: Diagnosis not present

## 2020-05-28 DIAGNOSIS — H40003 Preglaucoma, unspecified, bilateral: Secondary | ICD-10-CM | POA: Diagnosis not present

## 2020-05-28 DIAGNOSIS — H35352 Cystoid macular degeneration, left eye: Secondary | ICD-10-CM | POA: Diagnosis not present

## 2020-05-28 DIAGNOSIS — H2513 Age-related nuclear cataract, bilateral: Secondary | ICD-10-CM | POA: Diagnosis not present

## 2020-06-02 DIAGNOSIS — H43822 Vitreomacular adhesion, left eye: Secondary | ICD-10-CM | POA: Diagnosis not present

## 2020-06-02 DIAGNOSIS — H33192 Other retinoschisis and retinal cysts, left eye: Secondary | ICD-10-CM | POA: Diagnosis not present

## 2020-06-02 DIAGNOSIS — H35342 Macular cyst, hole, or pseudohole, left eye: Secondary | ICD-10-CM | POA: Diagnosis not present

## 2020-06-02 DIAGNOSIS — H35373 Puckering of macula, bilateral: Secondary | ICD-10-CM | POA: Diagnosis not present

## 2020-07-28 DIAGNOSIS — H35342 Macular cyst, hole, or pseudohole, left eye: Secondary | ICD-10-CM | POA: Diagnosis not present

## 2020-07-28 DIAGNOSIS — H35373 Puckering of macula, bilateral: Secondary | ICD-10-CM | POA: Diagnosis not present

## 2020-08-11 DIAGNOSIS — M1712 Unilateral primary osteoarthritis, left knee: Secondary | ICD-10-CM | POA: Diagnosis not present

## 2020-08-19 ENCOUNTER — Ambulatory Visit: Payer: Medicare Other | Admitting: Hematology

## 2020-09-01 DIAGNOSIS — H35372 Puckering of macula, left eye: Secondary | ICD-10-CM | POA: Diagnosis not present

## 2020-09-09 DIAGNOSIS — H35342 Macular cyst, hole, or pseudohole, left eye: Secondary | ICD-10-CM | POA: Diagnosis not present

## 2020-09-09 DIAGNOSIS — H35372 Puckering of macula, left eye: Secondary | ICD-10-CM | POA: Diagnosis not present

## 2020-10-06 DIAGNOSIS — G5622 Lesion of ulnar nerve, left upper limb: Secondary | ICD-10-CM | POA: Diagnosis not present

## 2020-10-06 DIAGNOSIS — M79602 Pain in left arm: Secondary | ICD-10-CM | POA: Diagnosis not present

## 2020-10-06 DIAGNOSIS — M79605 Pain in left leg: Secondary | ICD-10-CM | POA: Diagnosis not present

## 2020-10-07 DIAGNOSIS — H43811 Vitreous degeneration, right eye: Secondary | ICD-10-CM | POA: Diagnosis not present

## 2020-10-07 DIAGNOSIS — H35372 Puckering of macula, left eye: Secondary | ICD-10-CM | POA: Diagnosis not present

## 2020-10-19 DIAGNOSIS — Z23 Encounter for immunization: Secondary | ICD-10-CM | POA: Diagnosis not present

## 2020-10-19 DIAGNOSIS — R7303 Prediabetes: Secondary | ICD-10-CM | POA: Diagnosis not present

## 2020-10-19 DIAGNOSIS — Z0000 Encounter for general adult medical examination without abnormal findings: Secondary | ICD-10-CM | POA: Diagnosis not present

## 2020-10-19 DIAGNOSIS — E559 Vitamin D deficiency, unspecified: Secondary | ICD-10-CM | POA: Diagnosis not present

## 2020-10-22 ENCOUNTER — Other Ambulatory Visit: Payer: Self-pay | Admitting: Family Medicine

## 2020-10-22 DIAGNOSIS — H40003 Preglaucoma, unspecified, bilateral: Secondary | ICD-10-CM | POA: Diagnosis not present

## 2020-10-22 DIAGNOSIS — E2839 Other primary ovarian failure: Secondary | ICD-10-CM

## 2020-10-22 DIAGNOSIS — H2513 Age-related nuclear cataract, bilateral: Secondary | ICD-10-CM | POA: Diagnosis not present

## 2020-10-22 DIAGNOSIS — H43822 Vitreomacular adhesion, left eye: Secondary | ICD-10-CM | POA: Diagnosis not present

## 2020-10-22 DIAGNOSIS — H35352 Cystoid macular degeneration, left eye: Secondary | ICD-10-CM | POA: Diagnosis not present

## 2021-02-16 ENCOUNTER — Other Ambulatory Visit: Payer: Self-pay | Admitting: Family Medicine

## 2021-02-16 DIAGNOSIS — Z1231 Encounter for screening mammogram for malignant neoplasm of breast: Secondary | ICD-10-CM

## 2021-02-17 DIAGNOSIS — H43811 Vitreous degeneration, right eye: Secondary | ICD-10-CM | POA: Diagnosis not present

## 2021-02-17 DIAGNOSIS — H35371 Puckering of macula, right eye: Secondary | ICD-10-CM | POA: Diagnosis not present

## 2021-02-17 DIAGNOSIS — H43391 Other vitreous opacities, right eye: Secondary | ICD-10-CM | POA: Diagnosis not present

## 2021-02-17 DIAGNOSIS — H33192 Other retinoschisis and retinal cysts, left eye: Secondary | ICD-10-CM | POA: Diagnosis not present

## 2021-03-07 ENCOUNTER — Ambulatory Visit
Admission: RE | Admit: 2021-03-07 | Discharge: 2021-03-07 | Disposition: A | Discharge status: Home / Self Care | Payer: Medicare Other | Source: Ambulatory Visit | Attending: Family Medicine | Admitting: Family Medicine

## 2021-03-07 DIAGNOSIS — Z1231 Encounter for screening mammogram for malignant neoplasm of breast: Secondary | ICD-10-CM | POA: Diagnosis not present

## 2021-03-09 ENCOUNTER — Telehealth: Payer: Self-pay | Admitting: Hematology

## 2021-03-09 NOTE — Telephone Encounter (Signed)
Called patient regarding 02/28 scheduled message, rescheduled 03/08 appointment per patient's request.  ?

## 2021-03-16 ENCOUNTER — Other Ambulatory Visit: Payer: Medicare Other

## 2021-03-21 DIAGNOSIS — K219 Gastro-esophageal reflux disease without esophagitis: Secondary | ICD-10-CM | POA: Diagnosis not present

## 2021-03-25 DIAGNOSIS — H25813 Combined forms of age-related cataract, bilateral: Secondary | ICD-10-CM | POA: Diagnosis not present

## 2021-04-06 ENCOUNTER — Other Ambulatory Visit: Payer: Self-pay

## 2021-04-06 DIAGNOSIS — C50412 Malignant neoplasm of upper-outer quadrant of left female breast: Secondary | ICD-10-CM

## 2021-04-07 ENCOUNTER — Other Ambulatory Visit: Payer: Self-pay

## 2021-04-07 ENCOUNTER — Inpatient Hospital Stay: Payer: Medicare Other | Admitting: Hematology

## 2021-04-07 ENCOUNTER — Inpatient Hospital Stay: Payer: Medicare Other | Attending: Hematology

## 2021-04-07 ENCOUNTER — Encounter: Payer: Self-pay | Admitting: Hematology

## 2021-04-07 VITALS — BP 117/76 | HR 96 | Temp 98.0°F | Resp 17 | Ht 66.0 in | Wt 168.3 lb

## 2021-04-07 DIAGNOSIS — Z853 Personal history of malignant neoplasm of breast: Secondary | ICD-10-CM | POA: Insufficient documentation

## 2021-04-07 DIAGNOSIS — Z1231 Encounter for screening mammogram for malignant neoplasm of breast: Secondary | ICD-10-CM | POA: Diagnosis not present

## 2021-04-07 DIAGNOSIS — Z90710 Acquired absence of both cervix and uterus: Secondary | ICD-10-CM | POA: Insufficient documentation

## 2021-04-07 DIAGNOSIS — Z170 Estrogen receptor positive status [ER+]: Secondary | ICD-10-CM

## 2021-04-07 DIAGNOSIS — C50412 Malignant neoplasm of upper-outer quadrant of left female breast: Secondary | ICD-10-CM

## 2021-04-07 DIAGNOSIS — Z923 Personal history of irradiation: Secondary | ICD-10-CM | POA: Insufficient documentation

## 2021-04-07 DIAGNOSIS — K30 Functional dyspepsia: Secondary | ICD-10-CM | POA: Insufficient documentation

## 2021-04-07 LAB — CMP (CANCER CENTER ONLY)
ALT: 20 U/L (ref 0–44)
AST: 13 U/L — ABNORMAL LOW (ref 15–41)
Albumin: 4.1 g/dL (ref 3.5–5.0)
Alkaline Phosphatase: 79 U/L (ref 38–126)
Anion gap: 7 (ref 5–15)
BUN: 20 mg/dL (ref 8–23)
CO2: 28 mmol/L (ref 22–32)
Calcium: 9.5 mg/dL (ref 8.9–10.3)
Chloride: 106 mmol/L (ref 98–111)
Creatinine: 0.77 mg/dL (ref 0.44–1.00)
GFR, Estimated: >60 mL/min (ref >60)
Glucose, Bld: 110 mg/dL — ABNORMAL HIGH (ref 70–99)
Potassium: 3.8 mmol/L (ref 3.5–5.1)
Sodium: 141 mmol/L (ref 135–145)
Total Bilirubin: 0.9 mg/dL (ref 0.3–1.2)
Total Protein: 7.4 g/dL (ref 6.5–8.1)

## 2021-04-07 LAB — CBC WITH DIFFERENTIAL (CANCER CENTER ONLY)
Abs Immature Granulocytes: 0.01 10*3/uL (ref 0.00–0.07)
Basophils Absolute: 0 10*3/uL (ref 0.0–0.1)
Basophils Relative: 0 %
Eosinophils Absolute: 0.1 10*3/uL (ref 0.0–0.5)
Eosinophils Relative: 1 %
HCT: 40.2 % (ref 36.0–46.0)
Hemoglobin: 13.7 g/dL (ref 12.0–15.0)
Immature Granulocytes: 0 %
Lymphocytes Relative: 32 %
Lymphs Abs: 2.2 10*3/uL (ref 0.7–4.0)
MCH: 31.1 pg (ref 26.0–34.0)
MCHC: 34.1 g/dL (ref 30.0–36.0)
MCV: 91.4 fL (ref 80.0–100.0)
Monocytes Absolute: 0.5 10*3/uL (ref 0.1–1.0)
Monocytes Relative: 7 %
Neutro Abs: 4.2 10*3/uL (ref 1.7–7.7)
Neutrophils Relative %: 60 %
Platelet Count: 299 10*3/uL (ref 150–400)
RBC: 4.4 MIL/uL (ref 3.87–5.11)
RDW: 13.9 % (ref 11.5–15.5)
WBC Count: 7 10*3/uL (ref 4.0–10.5)
nRBC: 0 % (ref 0.0–0.2)

## 2021-04-07 NOTE — Progress Notes (Signed)
?Laurie Calhoun   ?[phone] [phone]   ?Clinic Follow up Note  ? ?Patient Care Team: ?Harlan Stains, MD as PCP - General (Family Medicine) ?Kyung Rudd, MD as Consulting Physician (Radiation Oncology) ?Rolm Bookbinder, MD as Consulting Physician (General Surgery) ?Gardenia Phlegm, NP as Nurse Practitioner (Hematology and Oncology) ?Truitt Merle, MD as Consulting Physician (Hematology) ? ?Date of Service:  04/07/2021 ? ?CHIEF COMPLAINT: f/u of left breast cancer ? ?CURRENT THERAPY:  ?Surveillance ? ?ASSESSMENT & PLAN:  ?Laurie Calhoun is a 66 y.o. post-hysterectomy female with  ? ?1. 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 She is s/p left breast lumpectomy and adjuvant radiation. She started letrozole in 03/2016 but due to arthralgia we switched her to Tamoxifen in 06/2016. She ultimately stopped in 04/2019 due to side effects. She is currently on surveillance.  ?-most recent mammogram on 03/07/21 was negative. ?-She is clinically doing well. Lab reviewed, her CBC and CMP are within normal limits. Her physical exam was unremarkable. There is no clinical concern for recurrence. ?-Continue Surveillance. Next mammogram in 02/2022. I advised her to watch for concerning symptoms of recurrence with RUQ abdomen, bone pain in spine, hip, significant weight loss, etc. She voiced good understanding  ?-F/u in March 2023 then once a year. She will F/u with her PCP in interim.  ?  ?2. Bone Health ?-05/08/16 DEXA was normal with T score of -0.9 at right femur neck.  ?-Continue calcium and vitamin D.  ?-Repeat DEXA scheduled for 08/09/21.  ? ? ?Plan ?-DEXA 08/09/21 ?-next mammogram due 02/2022 ?-lab and f/u with NP Lacie in 1 year, she will f/u with PCP in the interim  ? ? ?No problem-specific Assessment & Plan notes found for this encounter. ? ? ?SUMMARY OF ONCOLOGIC HISTORY: ?Oncology History Overview  Note  ?Cancer Staging ?Malignant neoplasm of upper-outer quadrant of left female breast ([place]) ?Staging form: Breast, AJCC 7th Edition ?- Clinical stage from 12/15/2015: Stage Unknown (TX, N0, M0) - Unsigned ?Staging form: Breast, AJCC 8th Edition ?- Pathologic stage from 03/05/2016: Stage IA (pT1c, pN0, cM0, G1, ER: Positive, PR: Positive, HER2: Negative, Oncotype DX score: 8) - Signed by Truitt Merle, MD on 03/05/2016 ? ? ?  ?Malignant neoplasm of upper-outer quadrant of left female breast ([place])  ?11/30/2015 Mammogram  ? Diagnostic mammogram of left breast showed persistent left breast architectural distortion in the upper outer quadrant, no responding findings on ultrasound, left axilla was negative on ultrasound. ?  ?12/01/2015 Initial Diagnosis  ? Malignant neoplasm of upper-outer quadrant of left female breast ([place]) ?  ?12/01/2015 Initial Biopsy  ? Left breast core needle biopsy in the upper outer quadrant showed invasive ductal carcinoma, G1, and atypical ductal hyperplasia. ?  ?12/01/2015 Receptors her2  ? ER 90% positive, PR 100% positive, strong staining, HER-2 negative, Ki-67 5% ? ?  ?12/21/2015 Surgery  ? Left breast lumpectomy and sentinel lymph node biopsy. ?  ?12/21/2015 Pathology Results  ? Left breast lumpectomy showed invasive ductal carcinoma with calcifications, grade 1, 1.4 cm, low-grade DCIS, surgical margins were negative (greater than 0.2cm). 2 sentinel lymph nodes were negative. No lymphovascular invasion. ?  ?12/21/2015 Oncotype testing  ? Recurrence score 8, which predicts 10 year risk of distant recurrence 6% with tamoxifen. ?  ?01/31/2016 - 02/25/2016 Radiation Therapy  ? 1. The Left breast was treated to 42.5 Gy in 17 fractions at 2.5 Gy per fraction. ?2. The Left breast  was boosted to 7.5 in 3 fractions at 2.5 Gy per fraction. ?  ?03/2016 - 04/2019 Anti-estrogen oral therapy  ? Letrozole 2.5 mg started 03/2016, changed to Tamoxifen in 06/2016 due to arthralgia. stopped antiestrogen therapy in  April/2021 due to side effects.  ?  ?04/11/2016 Imaging  ? 04/11/2016 DEXA ?ASSESSMENT: ?The BMD measured at Femur Neck Right is 0.914 g/cm2 with a T-score ?of -0.9.  ?  ?12/22/2016 Mammogram  ? 12/22/2016 Mammogram ?IMPRESSION: ?1. No mammographic evidence of malignancy involving either breast. ?2. Expected post lumpectomy and post radiation changes involving ?left breast. ?3. Expected post surgical changes related to the prior excisional ?biopsy involving the upper outer quadrant of the right breast. ?  ?12/25/2017 Mammogram  ? IMPRESSION: ?No evidence of malignancy within either breast. Stable postsurgical changes within the LEFT breast. ?  ?RECOMMENDATION: ?Bilateral diagnostic mammogram in 1 year. ?  ?01/15/2019 Mammogram  ? IMPRESSION: ?Expected post lumpectomy changes in the left breast. No mammographic ?evidence of malignancy in the bilateral breasts. ?  ?  ? ? ? ?INTERVAL HISTORY:  ?Laurie Calhoun is here for a follow up of breast cancer. She was last seen by me on 02/02/20. She presents to the clinic alone. ?She reports she is doing well overall. She notes her only complaint is recent indigestion; she adds she will have upper endoscopy and colonoscopy soon for full work up.  ?She notes some continued sensitivity/tenderness to her breast. ?  ?All other systems were reviewed with the patient and are negative. ? ?MEDICAL HISTORY:  ?Past Medical History:  ?Diagnosis Date  ? Allergy   ? Breast cancer ([place]) 12/01/2015  ? Left Breast  ? GERD (gastroesophageal reflux disease)   ? 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 History of kidney stones   ? Malignant neoplasm of upper-outer quadrant of left female breast ([place]) 12/07/2015  ? Personal history of radiation therapy   ? ? ?SURGICAL HISTORY: ?Past Surgical History:  ?Procedure Laterality Date  ? ABDOMINAL HYSTERECTOMY    ? BREAST BIOPSY Left 12/01/2015  ? BREAST LUMPECTOMY Left 12/21/2015  ? 2017  ? BREAST LUMPECTOMY WITH RADIOACTIVE SEED AND SENTINEL LYMPH NODE  BIOPSY Left 12/21/2015  ? Procedure: LEFT BREAST LUMPECTOMY WITH RADIOACTIVE SEED AND SENTINEL LYMPH NODE BIOPSY;  Surgeon: Rolm Bookbinder, MD;  Location: [facility];  Service: General;  Laterality: Left;  ? BREAST SURGERY    ? breast lumpectomy ?2000   ? COLONOSCOPY    ? PILONIDAL CYST EXCISION    ? ? ?I have reviewed the social history and family history with the patient and they are unchanged from previous note. ? ?ALLERGIES:  is allergic to penicillins. ? ?MEDICATIONS:  ?Current Outpatient Medications  ?Medication Sig Dispense Refill  ? APPLE CIDER VINEGAR PO Take by mouth.    ? diphenhydramine-acetaminophen (TYLENOL PM) 25-500 MG TABS tablet Take 2 tablets by mouth at bedtime as needed (for sleep.).    ? Multiple Vitamins-Minerals (CENTRUM ADULTS PO) Take by mouth.    ? potassium chloride SA (KLOR-CON) 20 MEQ tablet Take 1 tablet (20 mEq total) by mouth 2 (two) times daily for 5 days. 10 tablet 0  ? Vitamin D, Ergocalciferol, (DRISDOL) 50000 units CAPS capsule Take 50,000 Units by mouth every Monday.     ? zolpidem (AMBIEN) 10 MG tablet Take 10 mg by mouth at bedtime as needed.    ? ?No current facility-administered medications for this visit.  ? ? ?PHYSICAL EXAMINATION: ?ECOG PERFORMANCE STATUS: 0 -  Asymptomatic ? ?Vitals:  ? 04/07/21 1424  ?BP: 117/76  ?Pulse: 96  ?Resp: 17  ?Temp: 98 ?F (36.7 ?C)  ?SpO2: 95%  ? ?Wt Readings from Last 3 Encounters:  ?04/07/21 168 lb 4.8 oz (76.3 kg)  ?02/02/20 157 lb 3.2 oz (71.3 kg)  ?07/22/19 168 lb 3.2 oz (76.3 kg)  ?  ? ?GENERAL:alert, no distress and comfortable ?SKIN: skin color, texture, turgor are normal, no rashes or significant lesions ?EYES: normal, Conjunctiva are pink and non-injected, sclera clear  ?NECK: supple, thyroid normal size, non-tender, without nodularity ?LYMPH:  no palpable lymphadenopathy in the cervical, axillary ?LUNGS: clear to auscultation and percussion with normal breathing effort ?HEART: regular rate & rhythm and no murmurs and no lower  extremity edema ?ABDOMEN:abdomen soft, non-tender and normal bowel sounds ?Musculoskeletal:no cyanosis of digits and no clubbing  ?NEURO: alert & oriented x 3 with fluent speech, no focal motor/sensory deficits ?BREA

## 2021-05-10 DIAGNOSIS — K648 Other hemorrhoids: Secondary | ICD-10-CM | POA: Diagnosis not present

## 2021-05-10 DIAGNOSIS — D120 Benign neoplasm of cecum: Secondary | ICD-10-CM | POA: Diagnosis not present

## 2021-05-10 DIAGNOSIS — Z86010 Personal history of colonic polyps: Secondary | ICD-10-CM | POA: Diagnosis not present

## 2021-05-10 DIAGNOSIS — K621 Rectal polyp: Secondary | ICD-10-CM | POA: Diagnosis not present

## 2021-05-12 DIAGNOSIS — K621 Rectal polyp: Secondary | ICD-10-CM | POA: Diagnosis not present

## 2021-05-12 DIAGNOSIS — D120 Benign neoplasm of cecum: Secondary | ICD-10-CM | POA: Diagnosis not present

## 2021-06-09 DIAGNOSIS — H52222 Regular astigmatism, left eye: Secondary | ICD-10-CM | POA: Diagnosis not present

## 2021-06-09 DIAGNOSIS — H25812 Combined forms of age-related cataract, left eye: Secondary | ICD-10-CM | POA: Diagnosis not present

## 2021-08-09 ENCOUNTER — Other Ambulatory Visit: Payer: Medicare Other

## 2021-09-05 DIAGNOSIS — R197 Diarrhea, unspecified: Secondary | ICD-10-CM | POA: Diagnosis not present

## 2021-09-05 DIAGNOSIS — K2100 Gastro-esophageal reflux disease with esophagitis, without bleeding: Secondary | ICD-10-CM | POA: Diagnosis not present

## 2021-09-14 DIAGNOSIS — Z1283 Encounter for screening for malignant neoplasm of skin: Secondary | ICD-10-CM | POA: Diagnosis not present

## 2021-09-14 DIAGNOSIS — D225 Melanocytic nevi of trunk: Secondary | ICD-10-CM | POA: Diagnosis not present

## 2021-10-04 DIAGNOSIS — H2511 Age-related nuclear cataract, right eye: Secondary | ICD-10-CM | POA: Diagnosis not present

## 2021-10-04 DIAGNOSIS — H52203 Unspecified astigmatism, bilateral: Secondary | ICD-10-CM | POA: Diagnosis not present

## 2021-10-04 DIAGNOSIS — H4720 Unspecified optic atrophy: Secondary | ICD-10-CM | POA: Diagnosis not present

## 2021-10-04 DIAGNOSIS — H35372 Puckering of macula, left eye: Secondary | ICD-10-CM | POA: Diagnosis not present

## 2021-11-04 ENCOUNTER — Other Ambulatory Visit: Payer: Self-pay | Admitting: Family Medicine

## 2021-11-04 DIAGNOSIS — R079 Chest pain, unspecified: Secondary | ICD-10-CM

## 2021-11-04 DIAGNOSIS — Z0000 Encounter for general adult medical examination without abnormal findings: Secondary | ICD-10-CM | POA: Diagnosis not present

## 2021-11-04 DIAGNOSIS — Z853 Personal history of malignant neoplasm of breast: Secondary | ICD-10-CM | POA: Diagnosis not present

## 2021-11-04 DIAGNOSIS — K2100 Gastro-esophageal reflux disease with esophagitis, without bleeding: Secondary | ICD-10-CM | POA: Diagnosis not present

## 2021-11-04 DIAGNOSIS — Z23 Encounter for immunization: Secondary | ICD-10-CM | POA: Diagnosis not present

## 2021-11-04 DIAGNOSIS — Z79899 Other long term (current) drug therapy: Secondary | ICD-10-CM | POA: Diagnosis not present

## 2021-11-04 DIAGNOSIS — E559 Vitamin D deficiency, unspecified: Secondary | ICD-10-CM | POA: Diagnosis not present

## 2021-11-04 DIAGNOSIS — R7303 Prediabetes: Secondary | ICD-10-CM | POA: Diagnosis not present

## 2021-11-08 ENCOUNTER — Other Ambulatory Visit: Payer: Self-pay | Admitting: Family Medicine

## 2021-11-08 DIAGNOSIS — E2839 Other primary ovarian failure: Secondary | ICD-10-CM

## 2021-11-22 DIAGNOSIS — K2100 Gastro-esophageal reflux disease with esophagitis, without bleeding: Secondary | ICD-10-CM | POA: Diagnosis not present

## 2021-11-22 DIAGNOSIS — K449 Diaphragmatic hernia without obstruction or gangrene: Secondary | ICD-10-CM | POA: Diagnosis not present

## 2021-12-23 ENCOUNTER — Ambulatory Visit
Admission: RE | Admit: 2021-12-23 | Discharge: 2021-12-23 | Disposition: A | Discharge status: Home / Self Care | Payer: No Typology Code available for payment source | Source: Ambulatory Visit | Attending: Family Medicine | Admitting: Family Medicine

## 2021-12-23 DIAGNOSIS — R079 Chest pain, unspecified: Secondary | ICD-10-CM

## 2021-12-29 DIAGNOSIS — I2510 Atherosclerotic heart disease of native coronary artery without angina pectoris: Secondary | ICD-10-CM | POA: Diagnosis not present

## 2021-12-29 DIAGNOSIS — M940 Chondrocostal junction syndrome [Tietze]: Secondary | ICD-10-CM | POA: Diagnosis not present

## 2021-12-29 DIAGNOSIS — R079 Chest pain, unspecified: Secondary | ICD-10-CM | POA: Diagnosis not present

## 2022-01-05 ENCOUNTER — Ambulatory Visit: Payer: Medicare Other | Admitting: Cardiology

## 2022-01-05 ENCOUNTER — Encounter: Payer: Self-pay | Admitting: Cardiology

## 2022-01-05 VITALS — BP 114/74 | HR 78 | Resp 16 | Ht 66.0 in | Wt 157.0 lb

## 2022-01-05 DIAGNOSIS — R931 Abnormal findings on diagnostic imaging of heart and coronary circulation: Secondary | ICD-10-CM

## 2022-01-05 DIAGNOSIS — I2510 Atherosclerotic heart disease of native coronary artery without angina pectoris: Secondary | ICD-10-CM

## 2022-01-05 DIAGNOSIS — R072 Precordial pain: Secondary | ICD-10-CM | POA: Diagnosis not present

## 2022-01-05 DIAGNOSIS — I2584 Coronary atherosclerosis due to calcified coronary lesion: Secondary | ICD-10-CM | POA: Diagnosis not present

## 2022-01-05 MED ORDER — ASPIRIN 81 MG PO TBEC: 81.0000 mg | DELAYED_RELEASE_TABLET | Freq: Every day | ORAL | 12 refills | Status: DC

## 2022-01-05 NOTE — Progress Notes (Signed)
ID:  Laurie Calhoun, DOB 02-Feb-1955, MRN [id number]  PCP:  Harlan Stains, MD  Cardiologist:  Rex Kras, DO, [facility] (established care 01/05/2022)  REASON FOR CONSULT: Chest Pain and Coronary artery calcification  REQUESTING PHYSICIAN:  Harlan Stains, MD [address],  [place] [id number]  Chief Complaint  Patient presents with   Chest Pain   New Patient (Initial Visit)    Evaluation for CAD and chest apin    HPI  Laurie Calhoun is a 66 y.o. Caucasian female who presents to the clinic for evaluation of chest pain and coronary artery calcification at the request of Harlan Stains, MD. Her past medical history and cardiovascular risk factors include: Moderate coronary artery calcification, history of breast cancer (lumpectomy/radiation), prediabetes, GERD, ADHD.  Patient presents today for evaluation of chest pain and coronary artery calcification.  Her husband passed away at the age of 27 due to myocardial infarction.  She was recently diagnosed with prediabetes and given other family and friends diagnosed with atherosclerosis.  She wanted to be further evaluated.  She underwent a coronary calcium score and was noted to have a total score of 149 AU placing her at the 81st percentile.  After being informed that she has coronary artery calcification she had an episode of precordial chest pain few weeks ago.  She describes it as a pressure-like sensation "like the bra was too tight.",  Intensity 7 out of 10, lasted for 48 hours continuously, self-limited.  She has not had any reoccurrence of precordial pain.  No pain with effort related activities.  Her overall functional capacity remains stable.  FUNCTIONAL STATUS: She walks approximately 5 miles per day.  ALLERGIES: Allergies  Allergen Reactions   Penicillins Hives and Nausea Only     Has patient had a PCN reaction causing immediate rash, facial/tongue/throat swelling, SOB or lightheadedness with  hypotension:  # # YES # #  Has patient had a PCN reaction causing severe rash involving mucus membranes or skin necrosis:No Has patient had a PCN reaction that required hospitalization:No Has patient had a PCN reaction occurring within the last 10 years:No If all of the above answers are "NO", then may proceed with Cephalosporin use.     MEDICATION LIST PRIOR TO VISIT: Current Meds  Medication Sig   aspirin EC 81 MG tablet Take 1 tablet (81 mg total) by mouth daily. Swallow whole.   diphenhydramine-acetaminophen (TYLENOL PM) 25-500 MG TABS tablet Take 2 tablets by mouth at bedtime as needed (for sleep.).   pantoprazole (PROTONIX) 40 MG tablet Take 40 mg by mouth every 3 (three) days.   Vitamin D, Ergocalciferol, (DRISDOL) 50000 units CAPS capsule Take 50,000 Units by mouth every Monday.      PAST MEDICAL HISTORY: Past Medical History:  Diagnosis Date   Allergy    Breast cancer ([place]) 12/01/2015   Left Breast   GERD (gastroesophageal reflux disease)    Heart murmur    was born with this   History of kidney stones    Malignant neoplasm of upper-outer quadrant of left female breast ([place]) 12/07/2015   Personal history of radiation therapy     PAST SURGICAL HISTORY: Past Surgical History:  Procedure Laterality Date   ABDOMINAL HYSTERECTOMY     BREAST BIOPSY Left 12/01/2015   BREAST LUMPECTOMY Left 12/21/2015   2017   BREAST LUMPECTOMY WITH RADIOACTIVE SEED AND SENTINEL LYMPH NODE BIOPSY Left 12/21/2015   Procedure: LEFT BREAST LUMPECTOMY WITH RADIOACTIVE SEED AND SENTINEL LYMPH  NODE BIOPSY;  Surgeon: Rolm Bookbinder, MD;  Location: [facility];  Service: General;  Laterality: Left;   BREAST SURGERY     breast lumpectomy ?2000    COLONOSCOPY     PILONIDAL CYST EXCISION      FAMILY HISTORY: The patient family history includes Cancer in her father and mother; Cancer (age of onset: 42) in her cousin; Lung cancer in her mother.  SOCIAL HISTORY:  The patient  reports that she has  never smoked. She has never used smokeless tobacco. She reports current alcohol use of about 2.0 standard drinks of alcohol per week. She reports that she does not use drugs.  REVIEW OF SYSTEMS: Review of Systems  Cardiovascular:  Positive for chest pain (See HPI). Negative for claudication, dyspnea on exertion, irregular heartbeat, leg swelling, near-syncope, orthopnea, palpitations, paroxysmal nocturnal dyspnea and syncope.  Respiratory:  Negative for shortness of breath.   Hematologic/Lymphatic: Negative for bleeding problem.  Musculoskeletal:  Negative for muscle cramps and myalgias.  Neurological:  Positive for light-headedness (with fast change in position.). Negative for dizziness.    PHYSICAL EXAM:    01/05/2022    1:20 PM 04/07/2021    2:24 PM 02/02/2020   11:27 AM  Vitals with BMI  Height _0  _1  _2   Weight 157 lbs 168 lbs 5 oz 157 lbs 3 oz  BMI 25.35 86.76 19.50  Systolic 932 671 245  Diastolic 74 76 86  Pulse 78 96 94    Physical Exam  Constitutional: No distress.  Age appropriate, hemodynamically stable.   Neck: No JVD present.  Cardiovascular: Normal rate, regular rhythm, S1 normal, S2 normal, intact distal pulses and normal pulses. Exam reveals no gallop, no S3 and no S4.  No murmur heard. Pulmonary/Chest: Effort normal and breath sounds normal. No stridor. She has no wheezes. She has no rales.  Abdominal: Soft. Bowel sounds are normal. She exhibits no distension. There is no abdominal tenderness.  Musculoskeletal:        General: No edema.     Cervical back: Neck supple.  Neurological: She is alert and oriented to person, place, and time. She has intact cranial nerves (2-12).  Skin: Skin is warm and moist.   CARDIAC DATABASE: EKG: 01/05/2022: Normal sinus rhythm, 72 bpm, normal axis, without underlying ischemia or injury pattern  Echocardiogram: No results found for this or any previous visit from the past 1095 days.   Stress Testing: No results  found for this or any previous visit from the past 1095 days.   Heart Catheterization: None  CT Cardiac Scoring: 12/23/2021 Left Main: 0 LAD: 149 LCx: 0 RCA: 0 Total CAC 149AU,  81st percentile for patient's age, sex, and race.  LABORATORY DATA: External Labs: Collected: 11/04/2021 BUN 22, creatinine 0.76. Sodium 141, potassium 4.1, chloride 103, bicarb 27. AST 13, ALT 15, alkaline phosphatase 90. Hemoglobin 13.8, hematocrit 40.9%. Hemoglobin A1c 5.7  Collected: 10/14/2019 Total cholesterol 170, triglycerides 75, HDL 66, non-HDL 105, LDL 90  IMPRESSION:    ICD-10-CM   1. Precordial pain  R07.2 EKG 12-Lead    PCV MYOCARDIAL PERFUSION WO LEXISCAN    PCV ECHOCARDIOGRAM COMPLETE    Lipid Panel With LDL/HDL Ratio    LDL cholesterol, direct    CMP14+EGFR    2. Coronary atherosclerosis due to calcified coronary lesion  I25.10 PCV MYOCARDIAL PERFUSION WO LEXISCAN   I25.84 PCV ECHOCARDIOGRAM COMPLETE    aspirin EC 81 MG tablet    Lipid Panel With LDL/HDL Ratio  LDL cholesterol, direct    CMP14+EGFR    3. Agatston CAC score 100-199  R93.1 PCV MYOCARDIAL PERFUSION WO LEXISCAN    PCV ECHOCARDIOGRAM COMPLETE    aspirin EC 81 MG tablet    Lipid Panel With LDL/HDL Ratio    LDL cholesterol, direct    CMP14+EGFR       RECOMMENDATIONS: Juanda Luba is a 66 y.o. Caucasian female whose past medical history and cardiac risk factors include: Moderate coronary artery calcification, history of breast cancer (lumpectomy/radiation), prediabetes, GERD, ADHD.  Precordial pain Noncardiac based on symptoms. EKG nonischemic. Overall functional capacity stable. Ischemic workup as outlined below  Coronary atherosclerosis due to calcified coronary lesion / Agatston CAC score 100-199 Total CAC 149, 81st percentile. Check fasting lipid profile and CMP as baseline. Echo will be ordered to evaluate for structural heart disease and left ventricular systolic function. Exercise nuclear  stress test to evaluate functional capacity and reversible ischemia Recommend aspirin and statin therapy. Shared decision was to consider statin therapy after checking her baseline lipids-reasonable. Re emphasized the importance of improving her modifiable cardiovascular risk factors  Data Reviewed: I have independently reviewed external notes provided by the referring provider as part of this office visit.   I have independently reviewed results of coronary calcium score, EKG, labs as part of medical decision making. I have ordered the following tests:  Orders Placed This Encounter  Procedures   Lipid Panel With LDL/HDL Ratio   LDL cholesterol, direct   CMP14+EGFR   PCV MYOCARDIAL PERFUSION WO LEXISCAN    Standing Status:   Future    Standing Expiration Date:   01/06/2023   EKG 12-Lead   PCV ECHOCARDIOGRAM COMPLETE    Standing Status:   Future    Standing Expiration Date:   01/06/2023  I have made medications changes at today's encounter as noted above.  FINAL MEDICATION LIST END OF ENCOUNTER: Meds ordered this encounter  Medications   aspirin EC 81 MG tablet    Sig: Take 1 tablet (81 mg total) by mouth daily. Swallow whole.    Dispense:  30 tablet    Refill:  12    Medications Discontinued During This Encounter  Medication Reason   zolpidem (AMBIEN) 10 MG tablet Patient Preference   potassium chloride SA (KLOR-CON) 20 MEQ tablet Patient Preference   Multiple Vitamins-Minerals (CENTRUM ADULTS PO) Patient Preference   APPLE CIDER VINEGAR PO Patient Preference     Current Outpatient Medications:    aspirin EC 81 MG tablet, Take 1 tablet (81 mg total) by mouth daily. Swallow whole., Disp: 30 tablet, Rfl: 12   diphenhydramine-acetaminophen (TYLENOL PM) 25-500 MG TABS tablet, Take 2 tablets by mouth at bedtime as needed (for sleep.)., Disp: , Rfl:    pantoprazole (PROTONIX) 40 MG tablet, Take 40 mg by mouth every 3 (three) days., Disp: , Rfl:    Vitamin D, Ergocalciferol,  (DRISDOL) 50000 units CAPS capsule, Take 50,000 Units by mouth every Monday. , Disp: , Rfl:   Orders Placed This Encounter  Procedures   Lipid Panel With LDL/HDL Ratio   LDL cholesterol, direct   CMP14+EGFR   PCV MYOCARDIAL PERFUSION WO LEXISCAN   EKG 12-Lead   PCV ECHOCARDIOGRAM COMPLETE    There are no Patient Instructions on file for this visit.   --Continue cardiac medications as reconciled in final medication list. --Return in about 4 weeks (around 02/02/2022) for Follow up, Coronary artery calcification, Review test results. or sooner if needed. --Continue follow-up with your primary  care physician regarding the management of your other chronic comorbid conditions.  Patient's questions and concerns were addressed to her satisfaction. She voices understanding of the instructions provided during this encounter.   This note was created using a voice recognition software as a result there may be grammatical errors inadvertently enclosed that do not reflect the nature of this encounter. Every attempt is made to correct such errors.  Rex Kras, [place], [facility]  Pager: [phone] Office: [phone]

## 2022-01-06 DIAGNOSIS — R072 Precordial pain: Secondary | ICD-10-CM | POA: Diagnosis not present

## 2022-01-06 DIAGNOSIS — I2510 Atherosclerotic heart disease of native coronary artery without angina pectoris: Secondary | ICD-10-CM | POA: Diagnosis not present

## 2022-01-06 DIAGNOSIS — R931 Abnormal findings on diagnostic imaging of heart and coronary circulation: Secondary | ICD-10-CM | POA: Diagnosis not present

## 2022-01-06 DIAGNOSIS — I2584 Coronary atherosclerosis due to calcified coronary lesion: Secondary | ICD-10-CM | POA: Diagnosis not present

## 2022-01-07 LAB — LIPID PANEL WITH LDL/HDL RATIO
Cholesterol, Total: 148 mg/dL (ref 100–199)
HDL: 57 mg/dL (ref >39)
LDL Chol Calc (NIH): 71 mg/dL (ref 0–99)
LDL/HDL Ratio: 1.2 ratio (ref 0.0–3.2)
Triglycerides: 112 mg/dL (ref 0–149)
VLDL Cholesterol Cal: 20 mg/dL (ref 5–40)

## 2022-01-07 LAB — CMP14+EGFR
ALT: 17 IU/L (ref 0–32)
AST: 15 IU/L (ref 0–40)
Albumin/Globulin Ratio: 1.3 (ref 1.2–2.2)
Albumin: 3.8 g/dL — ABNORMAL LOW (ref 3.9–4.9)
Alkaline Phosphatase: 78 IU/L (ref 44–121)
BUN/Creatinine Ratio: 37 — ABNORMAL HIGH (ref 12–28)
BUN: 23 mg/dL (ref 8–27)
Bilirubin Total: 0.6 mg/dL (ref 0.0–1.2)
CO2: 19 mmol/L — ABNORMAL LOW (ref 20–29)
Calcium: 8.8 mg/dL (ref 8.7–10.3)
Chloride: 104 mmol/L (ref 96–106)
Creatinine, Ser: 0.62 mg/dL (ref 0.57–1.00)
Globulin, Total: 2.9 g/dL (ref 1.5–4.5)
Glucose: 96 mg/dL (ref 70–99)
Potassium: 4.5 mmol/L (ref 3.5–5.2)
Sodium: 138 mmol/L (ref 134–144)
Total Protein: 6.7 g/dL (ref 6.0–8.5)
eGFR: 97 mL/min/{1.73_m2} (ref >59)

## 2022-01-07 LAB — LDL CHOLESTEROL, DIRECT: LDL Direct: 78 mg/dL (ref 0–99)

## 2022-01-16 ENCOUNTER — Ambulatory Visit: Payer: Medicare Other

## 2022-01-16 DIAGNOSIS — I2510 Atherosclerotic heart disease of native coronary artery without angina pectoris: Secondary | ICD-10-CM

## 2022-01-16 DIAGNOSIS — R072 Precordial pain: Secondary | ICD-10-CM

## 2022-01-16 DIAGNOSIS — R931 Abnormal findings on diagnostic imaging of heart and coronary circulation: Secondary | ICD-10-CM | POA: Diagnosis not present

## 2022-01-16 DIAGNOSIS — I2584 Coronary atherosclerosis due to calcified coronary lesion: Secondary | ICD-10-CM | POA: Diagnosis not present

## 2022-01-17 LAB — PCV MYOCARDIAL PERFUSION WO LEXISCAN
Angina Index: 0
Base ST Depression (mm): 0 mm
ST Depression (mm): 0 mm

## 2022-01-18 ENCOUNTER — Ambulatory Visit: Payer: Medicare Other

## 2022-01-18 DIAGNOSIS — I2510 Atherosclerotic heart disease of native coronary artery without angina pectoris: Secondary | ICD-10-CM

## 2022-01-18 DIAGNOSIS — R072 Precordial pain: Secondary | ICD-10-CM | POA: Diagnosis not present

## 2022-01-18 DIAGNOSIS — R931 Abnormal findings on diagnostic imaging of heart and coronary circulation: Secondary | ICD-10-CM

## 2022-02-02 ENCOUNTER — Ambulatory Visit: Payer: Medicare Other | Admitting: Cardiology

## 2022-02-02 ENCOUNTER — Encounter: Payer: Self-pay | Admitting: Cardiology

## 2022-02-02 VITALS — BP 114/72 | HR 79 | Resp 16 | Ht 66.0 in | Wt 157.4 lb

## 2022-02-02 DIAGNOSIS — R072 Precordial pain: Secondary | ICD-10-CM | POA: Diagnosis not present

## 2022-02-02 DIAGNOSIS — R931 Abnormal findings on diagnostic imaging of heart and coronary circulation: Secondary | ICD-10-CM | POA: Diagnosis not present

## 2022-02-02 DIAGNOSIS — I2510 Atherosclerotic heart disease of native coronary artery without angina pectoris: Secondary | ICD-10-CM | POA: Diagnosis not present

## 2022-02-02 NOTE — Progress Notes (Signed)
ID:  Laurie Calhoun, DOB 09/14/1955, MRN [id number]  PCP:  Harlan Stains, MD  Cardiologist:  Rex Kras, DO, [facility] (established care 01/05/2022)  Date: 02/02/22 Last Office Visit: 01/05/2022  Chief Complaint  Patient presents with   Coronary Artery Calcification   Follow-up    4 weeks and test results    HPI  Laurie Calhoun is a 67 y.o. Caucasian female whose past medical history and cardiovascular risk factors include: Moderate coronary artery calcification, history of breast cancer (lumpectomy/radiation), prediabetes, GERD, ADHD.  Patient was referred to practice for evaluation of chest pain and coronary artery calcification.  She underwent a coronary calcium score and her total score of 149 AU placing her at the 81st percentile. After being informed that she has coronary artery calcification she had an episode of precordial chest pain likely non-cardiac but given her concerns and risk factors she underwent echo and stress test.   Since the last office visit she is asymptomatic with regards to chest pain or shortness of breath.  FUNCTIONAL STATUS: She walks approximately 5 miles per day.  ALLERGIES: Allergies  Allergen Reactions   Penicillins Hives and Nausea Only     Has patient had a PCN reaction causing immediate rash, facial/tongue/throat swelling, SOB or lightheadedness with hypotension:  # # YES # #  Has patient had a PCN reaction causing severe rash involving mucus membranes or skin necrosis:No Has patient had a PCN reaction that required hospitalization:No Has patient had a PCN reaction occurring within the last 10 years:No If all of the above answers are "NO", then may proceed with Cephalosporin use.     MEDICATION LIST PRIOR TO VISIT: Current Meds  Medication Sig   aspirin EC 81 MG tablet Take 1 tablet (81 mg total) by mouth daily. Swallow whole.   Vitamin D, Ergocalciferol, (DRISDOL) 50000 units CAPS capsule Take 50,000 Units by mouth every Monday.       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   History of kidney stones    Malignant neoplasm of upper-outer quadrant of left female breast ([place]) 12/07/2015   Personal history of radiation therapy     PAST SURGICAL HISTORY: Past Surgical History:  Procedure Laterality Date   ABDOMINAL HYSTERECTOMY     BREAST BIOPSY Left 12/01/2015   BREAST LUMPECTOMY Left 12/21/2015   2017   BREAST LUMPECTOMY WITH RADIOACTIVE SEED AND SENTINEL LYMPH NODE BIOPSY Left 12/21/2015   Procedure: LEFT BREAST LUMPECTOMY WITH RADIOACTIVE SEED AND SENTINEL LYMPH NODE BIOPSY;  Surgeon: Rolm Bookbinder, MD;  Location: [place];  Service: General;  Laterality: Left;   BREAST SURGERY     breast lumpectomy ?2000    COLONOSCOPY     PILONIDAL CYST EXCISION      FAMILY HISTORY: The patient family history includes Cancer in her father and mother; Cancer (age of onset: 67) in her cousin; Lung cancer in her mother.  SOCIAL HISTORY:  The patient  reports that she has never smoked. She has never used smokeless tobacco. She reports current alcohol use of about 2.0 standard drinks of alcohol per week. She reports that she does not use drugs.  REVIEW OF SYSTEMS: Review of Systems  Cardiovascular:  Negative for chest pain, claudication, dyspnea on exertion, irregular heartbeat, leg swelling, near-syncope, orthopnea, palpitations, paroxysmal nocturnal dyspnea  and syncope.  Respiratory:  Negative for shortness of breath.   Hematologic/Lymphatic: Negative for bleeding problem.  Musculoskeletal:  Negative for muscle cramps and myalgias.  Neurological:  Negative for dizziness and light-headedness.    PHYSICAL EXAM:    02/02/2022    9:20 AM 01/05/2022    1:20 PM 04/07/2021    2:24 PM  Vitals with BMI  Height '5\' 6"'$  '5\' 6"'$  '5\' 6"'$   Weight 157 lbs 6 oz 157 lbs 168  lbs 5 oz  BMI 25.42 03.88 82.80  Systolic 034 917 915  Diastolic 72 74 76  Pulse 79 78 96    Physical Exam  Constitutional: No distress.  Age appropriate, hemodynamically stable.   Neck: No JVD present.  Cardiovascular: Normal rate, regular rhythm, S1 normal, S2 normal, intact distal pulses and normal pulses. Exam reveals no gallop, no S3 and no S4.  No murmur heard. Pulmonary/Chest: Effort normal and breath sounds normal. No stridor. She has no wheezes. She has no rales.  Abdominal: Soft. Bowel sounds are normal. She exhibits no distension. There is no abdominal tenderness.  Musculoskeletal:        General: No edema.     Cervical back: Neck supple.  Neurological: She is alert and oriented to person, place, and time. She has intact cranial nerves (2-12).  Skin: Skin is warm and moist.   CARDIAC DATABASE: EKG: 01/05/2022: Normal sinus rhythm, 72 bpm, normal axis, without underlying ischemia or injury pattern  Echocardiogram: 01/18/2022:  Normal LV systolic function with visual EF 60-65%. Left ventricle cavity is normal in size. Mild hypertrophy of the left ventricle. Normal global wall motion. Normal diastolic filling pattern, normal LAP. Mild tricuspid regurgitation. No evidence of pulmonary hypertension. No prior study for comparison.    Stress Testing: Exercise nuclear stress test 01/16/2022:  Myocardial perfusion is normal. Overall LV systolic function is normal without regional wall motion abnormalities. Stress LV EF: 67%.  Normal ECG stress. The patient exercised for 3 minutes and 3 seconds of a Bruce protocol, achieving approximately 4.68 METs & 89% MPHR. Markedly reduced exercise tolerance. Normal BPP response. No chest pain.  No previous exam available for comparison. Low risk.    Heart Catheterization: None  CT Cardiac Scoring: 12/23/2021 Left Main: 0 LAD: 149 LCx: 0 RCA: 0 Total CAC 149AU,  81st percentile for patient's age, sex, and race.  LABORATORY  DATA: External Labs: Collected: 11/04/2021 BUN 22, creatinine 0.76. Sodium 141, potassium 4.1, chloride 103, bicarb 27. AST 13, ALT 15, alkaline phosphatase 90. Hemoglobin 13.8, hematocrit 40.9%. Hemoglobin A1c 5.7  Collected: 10/14/2019 Total cholesterol 170, triglycerides 75, HDL 66, non-HDL 105, LDL 90  Lab Results  Component Value Date   CHOL 148 01/06/2022   HDL 57 01/06/2022   LDLCALC 71 01/06/2022   LDLDIRECT 78 01/06/2022   TRIG 112 01/06/2022     IMPRESSION:    ICD-10-CM   1. Precordial pain  R07.2     2. Coronary atherosclerosis due to calcified coronary lesion  I25.10    I25.84     3. Agatston CAC score 100-199  R93.1        RECOMMENDATIONS: Laurie Calhoun is a 66 y.o. Caucasian female whose past medical history and cardiac risk factors include: Moderate coronary artery calcification, history of breast cancer (lumpectomy/radiation), prediabetes, GERD, ADHD.  Precordial pain No reoccurrence since last office visit. Prior EKG nonischemic. Echocardiogram notes preserved EF without any regional wall motion abnormalities or significant valvular heart. Stress test: Low risk No additional cardiovascular  testing needed at this time. Educated her on the importance of improving her modifiable cardiovascular risk factors  Coronary atherosclerosis due to calcified coronary lesion / Agatston CAC score 100-199 Total CAC 149, 81st percentile. Fasting lipid profile reviewed. Patient remains hesitant to be on statin medications. Patient feels that her coronary calcification may also be secondary to chest wall radiation given her history of breast cancer.  This is a possibility as majority of her CAC is in the LAD distribution.  However despite well-controlled lipids she still has moderate CAC, and greater than 50th percentile and prediabetic so recommended low intensity statin.  She would like to hold off for now.  FINAL MEDICATION LIST END OF ENCOUNTER: No orders of  the defined types were placed in this encounter.   Medications Discontinued During This Encounter  Medication Reason   diphenhydramine-acetaminophen (TYLENOL PM) 25-500 MG TABS tablet Patient Preference   pantoprazole (PROTONIX) 40 MG tablet Patient Preference     Current Outpatient Medications:    aspirin EC 81 MG tablet, Take 1 tablet (81 mg total) by mouth daily. Swallow whole., Disp: 30 tablet, Rfl: 12   Vitamin D, Ergocalciferol, (DRISDOL) 50000 units CAPS capsule, Take 50,000 Units by mouth every Monday. , Disp: , Rfl:   No orders of the defined types were placed in this encounter.   There are no Patient Instructions on file for this visit.   --Continue cardiac medications as reconciled in final medication list. --Return in about 1 year (around 02/03/2023), or if symptoms worsen or fail to improve, for Follow up, Coronary artery calcification. or sooner if needed. --Continue follow-up with your primary care physician regarding the management of your other chronic comorbid conditions.  Patient's questions and concerns were addressed to her satisfaction. She voices understanding of the instructions provided during this encounter.   This note was created using a voice recognition software as a result there may be grammatical errors inadvertently enclosed that do not reflect the nature of this encounter. Every attempt is made to correct such errors.  Rex Kras, [place], [facility]  Pager: [phone] Office: [phone]

## 2022-03-07 ENCOUNTER — Other Ambulatory Visit: Payer: Self-pay | Admitting: Family Medicine

## 2022-03-07 DIAGNOSIS — M25422 Effusion, left elbow: Secondary | ICD-10-CM | POA: Diagnosis not present

## 2022-03-07 DIAGNOSIS — M7022 Olecranon bursitis, left elbow: Secondary | ICD-10-CM | POA: Diagnosis not present

## 2022-03-07 DIAGNOSIS — Z1231 Encounter for screening mammogram for malignant neoplasm of breast: Secondary | ICD-10-CM

## 2022-04-04 ENCOUNTER — Other Ambulatory Visit: Payer: Self-pay | Admitting: Nurse Practitioner

## 2022-04-04 DIAGNOSIS — Z170 Estrogen receptor positive status [ER+]: Secondary | ICD-10-CM

## 2022-04-04 NOTE — Progress Notes (Unsigned)
Patient Care Team: Harlan Stains, MD as PCP - General (Family Medicine) Kyung Rudd, MD as Consulting Physician (Radiation Oncology) Rolm Bookbinder, MD as Consulting Physician (General Surgery) Delice Bison, Charlestine Massed, NP as Nurse Practitioner (Hematology and Oncology) Truitt Merle, MD as Consulting Physician (Hematology)   CHIEF COMPLAINT: Follow-up left breast cancer  Oncology History Overview Note  Cancer Staging Malignant neoplasm of upper-outer quadrant of left female breast [facility]) Staging form: Breast, AJCC 7th Edition - Clinical stage from 12/15/2015: Stage Unknown ([place], N0, M0) - Unsigned Staging form: Breast, AJCC 8th Edition - Pathologic stage from 03/05/2016: Stage IA (pT1c, pN0, cM0, G1, ER: Positive, PR: Positive, HER2: Negative, Oncotype DX score: 8) - Signed by Truitt Merle, MD on 03/05/2016     Malignant neoplasm of upper-outer quadrant of left female breast ([place])  11/30/2015 Mammogram   Diagnostic mammogram of left breast showed persistent left breast architectural distortion in the upper outer quadrant, no responding findings on ultrasound, left axilla was negative on ultrasound.   12/01/2015 Initial Diagnosis   Malignant neoplasm of upper-outer quadrant of left female breast ([place])   12/01/2015 Initial Biopsy   Left breast core needle biopsy in the upper outer quadrant showed invasive ductal carcinoma, G1, and atypical ductal hyperplasia.   12/01/2015 Receptors her2   ER 90% positive, PR 100% positive, strong staining, HER-2 negative, Ki-67 5%    12/21/2015 Surgery   Left breast lumpectomy and sentinel lymph node biopsy.   12/21/2015 Pathology Results   Left breast lumpectomy showed invasive ductal carcinoma with calcifications, grade 1, 1.4 cm, low-grade DCIS, surgical margins were negative (greater than 0.2cm). 2 sentinel lymph nodes were negative. No lymphovascular invasion.   12/21/2015 Oncotype testing   Recurrence score 8, which predicts 10 year risk of  distant recurrence 6% with tamoxifen.   01/31/2016 - 02/25/2016 Radiation Therapy   1. The Left breast was treated to 42.5 Gy in 17 fractions at 2.5 Gy per fraction. 2. The Left breast was boosted to 7.5 in 3 fractions at 2.5 Gy per fraction.   03/2016 - 04/2019 Anti-estrogen oral therapy   Letrozole 2.5 mg started 03/2016, changed to Tamoxifen in 06/2016 due to arthralgia. stopped antiestrogen therapy in April/2021 due to side effects.    04/11/2016 Imaging   04/11/2016 DEXA ASSESSMENT: The BMD measured at Femur Neck Right is 0.914 g/cm2 with a T-score of -0.9.    12/22/2016 Mammogram   12/22/2016 Mammogram IMPRESSION: 1. No mammographic evidence of malignancy involving either breast. 2. Expected post lumpectomy and post radiation changes involving left breast. 3. Expected post surgical changes related to the prior excisional biopsy involving the upper outer quadrant of the right breast.   12/25/2017 Mammogram   IMPRESSION: No evidence of malignancy within either breast. Stable postsurgical changes within the LEFT breast.   RECOMMENDATION: Bilateral diagnostic mammogram in 1 year.   01/15/2019 Mammogram   IMPRESSION: Expected post lumpectomy changes in the left breast. No mammographic evidence of malignancy in the bilateral breasts.        CURRENT THERAPY: Surveillance  INTERVAL HISTORY Ms. Sonia returns for follow-up as scheduled, last seen by Dr. Burr Medico 04/07/2021.  Mammogram scheduled in May and bone density in July.  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   History of kidney stones    Malignant  neoplasm of upper-outer quadrant of left female breast ([place]) 12/07/2015   Personal history of radiation therapy      Past Surgical History:  Procedure Laterality Date   ABDOMINAL HYSTERECTOMY     BREAST BIOPSY Left 12/01/2015   BREAST  LUMPECTOMY Left 12/21/2015   2017   BREAST LUMPECTOMY WITH RADIOACTIVE SEED AND SENTINEL LYMPH NODE BIOPSY Left 12/21/2015   Procedure: LEFT BREAST LUMPECTOMY WITH RADIOACTIVE SEED AND SENTINEL LYMPH NODE BIOPSY;  Surgeon: Rolm Bookbinder, MD;  Location: [place];  Service: General;  Laterality: Left;   BREAST SURGERY     breast lumpectomy ?2000    COLONOSCOPY     PILONIDAL CYST EXCISION       Outpatient Encounter Medications as of 04/05/2022  Medication Sig   aspirin EC 81 MG tablet Take 1 tablet (81 mg total) by mouth daily. Swallow whole.   Vitamin D, Ergocalciferol, (DRISDOL) 50000 units CAPS capsule Take 50,000 Units by mouth every Monday.    No facility-administered encounter medications on file as of 04/05/2022.     There were no vitals filed for this visit. There is no height or weight on file to calculate BMI.   PHYSICAL EXAM GENERAL:alert, no distress and comfortable SKIN: no rash  EYES: sclera clear NECK: without mass LYMPH:  no palpable cervical or supraclavicular lymphadenopathy  LUNGS: clear with normal breathing effort HEART: regular rate & rhythm, no lower extremity edema ABDOMEN: abdomen soft, non-tender and normal bowel sounds NEURO: alert & oriented x 3 with fluent speech, no focal motor/sensory deficits Breast exam:  PAC without erythema    CBC    Component Value Date/Time   WBC 7.0 04/07/2021 1342   WBC 6.3 02/02/2020 1111   RBC 4.40 04/07/2021 1342   HGB 13.7 04/07/2021 1342   HGB 12.6 09/15/2016 0841   HCT 40.2 04/07/2021 1342   HCT 38.0 09/15/2016 0841   PLT 299 04/07/2021 1342   PLT 252 09/15/2016 0841   MCV 91.4 04/07/2021 1342   MCV 93.4 09/15/2016 0841   MCH 31.1 04/07/2021 1342   MCHC 34.1 04/07/2021 1342   RDW 13.9 04/07/2021 1342   RDW 13.9 09/15/2016 0841   LYMPHSABS 2.2 04/07/2021 1342   LYMPHSABS 1.5 09/15/2016 0841   MONOABS 0.5 04/07/2021 1342   MONOABS 0.4 09/15/2016 0841   EOSABS 0.1 04/07/2021 1342   EOSABS 0.1 09/15/2016  0841   BASOSABS 0.0 04/07/2021 1342   BASOSABS 0.0 09/15/2016 0841     CMP     Component Value Date/Time   NA 138 01/06/2022 0749   NA 140 09/15/2016 0841   K 4.5 01/06/2022 0749   K 4.3 09/15/2016 0841   CL 104 01/06/2022 0749   CO2 19 (L) 01/06/2022 0749   CO2 26 09/15/2016 0841   GLUCOSE 96 01/06/2022 0749   GLUCOSE 110 (H) 04/07/2021 1342   GLUCOSE 105 09/15/2016 0841   BUN 23 01/06/2022 0749   BUN 23.4 09/15/2016 0841   CREATININE 0.62 01/06/2022 0749   CREATININE 0.77 04/07/2021 1342   CREATININE 0.9 09/15/2016 0841   CALCIUM 8.8 01/06/2022 0749   CALCIUM 9.3 09/15/2016 0841   PROT 6.7 01/06/2022 0749   PROT 7.2 09/15/2016 0841   ALBUMIN 3.8 (L) 01/06/2022 0749   ALBUMIN 3.5 09/15/2016 0841   AST 15 01/06/2022 0749   AST 13 (L) 04/07/2021 1342   AST 13 09/15/2016 0841   ALT 17 01/06/2022 0749   ALT 20 04/07/2021 1342   ALT 17 09/15/2016 0841  ALKPHOS 78 01/06/2022 0749   ALKPHOS 70 09/15/2016 0841   BILITOT 0.6 01/06/2022 0749   BILITOT 0.9 04/07/2021 1342   BILITOT 0.86 09/15/2016 0841   GFRNONAA >60 04/07/2021 1342   GFRAA >60 07/22/2019 0841     ASSESSMENT & PLAN:Conny Zanyah Wach is a 67 y.o. female with    1. Malignant neoplasm of upper-outer quadrant of left breast, invasive ductal carcinoma, grade 1, pT1cN0M0, ER+/PR+/HER2- -Diagnosed in 11/2015. S/p left breast lumpectomy and adjuvant radiation.  -Oncotype 8, low risk; adjuvant chemotherapy was not recommended.  -She started letrozole in 03/2016 but due to arthralgia we switched her to Tamoxifen in 06/2016, arthralgia resolved then.  However she developed recurrent joint pain affecting activity and quality of life, tamoxifen was switched to anastrozole but ultimately discontinued in 04/2019 due to arthralgia -On surveillance  -Last mammogram 02/2021 was benign   3. Bone Health -05/08/2016 DEXA was normal with T score of -0.9 at right femur neck.  -Continue calcium and vitamin D and weightbearing  exercises -We will repeat vitamin D level at next visit    PLAN:  No orders of the defined types were placed in this encounter.     All questions were answered. The patient knows to call the clinic with any problems, questions or concerns. No barriers to learning were detected. I spent *** counseling the patient face to face. The total time spent in the appointment was *** and more than 50% was on counseling, review of test results, and coordination of care.   Cira Rue, NP-C @DATE @

## 2022-04-05 ENCOUNTER — Encounter: Payer: Self-pay | Admitting: Nurse Practitioner

## 2022-04-05 ENCOUNTER — Other Ambulatory Visit: Payer: Self-pay

## 2022-04-05 ENCOUNTER — Inpatient Hospital Stay: Payer: Medicare Other

## 2022-04-05 ENCOUNTER — Inpatient Hospital Stay: Payer: Medicare Other | Attending: Nurse Practitioner | Admitting: Nurse Practitioner

## 2022-04-05 VITALS — BP 116/78 | HR 67 | Temp 97.7°F | Wt 161.0 lb

## 2022-04-05 DIAGNOSIS — Z170 Estrogen receptor positive status [ER+]: Secondary | ICD-10-CM | POA: Diagnosis not present

## 2022-04-05 DIAGNOSIS — Z853 Personal history of malignant neoplasm of breast: Secondary | ICD-10-CM | POA: Diagnosis not present

## 2022-04-05 DIAGNOSIS — L299 Pruritus, unspecified: Secondary | ICD-10-CM | POA: Diagnosis not present

## 2022-04-05 DIAGNOSIS — C50412 Malignant neoplasm of upper-outer quadrant of left female breast: Secondary | ICD-10-CM

## 2022-04-05 DIAGNOSIS — Z7982 Long term (current) use of aspirin: Secondary | ICD-10-CM | POA: Insufficient documentation

## 2022-04-05 LAB — CBC WITH DIFFERENTIAL (CANCER CENTER ONLY)
Abs Immature Granulocytes: 0.01 10*3/uL (ref 0.00–0.07)
Basophils Absolute: 0 10*3/uL (ref 0.0–0.1)
Basophils Relative: 0 %
Eosinophils Absolute: 0.1 10*3/uL (ref 0.0–0.5)
Eosinophils Relative: 2 %
HCT: 39.8 % (ref 36.0–46.0)
Hemoglobin: 13.3 g/dL (ref 12.0–15.0)
Immature Granulocytes: 0 %
Lymphocytes Relative: 32 %
Lymphs Abs: 1.9 10*3/uL (ref 0.7–4.0)
MCH: 31.7 pg (ref 26.0–34.0)
MCHC: 33.4 g/dL (ref 30.0–36.0)
MCV: 95 fL (ref 80.0–100.0)
Monocytes Absolute: 0.4 10*3/uL (ref 0.1–1.0)
Monocytes Relative: 7 %
Neutro Abs: 3.4 10*3/uL (ref 1.7–7.7)
Neutrophils Relative %: 59 %
Platelet Count: 263 10*3/uL (ref 150–400)
RBC: 4.19 MIL/uL (ref 3.87–5.11)
RDW: 13.4 % (ref 11.5–15.5)
WBC Count: 5.8 10*3/uL (ref 4.0–10.5)
nRBC: 0 % (ref 0.0–0.2)

## 2022-04-05 LAB — CMP (CANCER CENTER ONLY)
ALT: 15 U/L (ref 0–44)
AST: 13 U/L — ABNORMAL LOW (ref 15–41)
Albumin: 4 g/dL (ref 3.5–5.0)
Alkaline Phosphatase: 67 U/L (ref 38–126)
Anion gap: 7 (ref 5–15)
BUN: 17 mg/dL (ref 8–23)
CO2: 27 mmol/L (ref 22–32)
Calcium: 8.9 mg/dL (ref 8.9–10.3)
Chloride: 107 mmol/L (ref 98–111)
Creatinine: 0.74 mg/dL (ref 0.44–1.00)
GFR, Estimated: >60 mL/min (ref >60)
Glucose, Bld: 106 mg/dL — ABNORMAL HIGH (ref 70–99)
Potassium: 4.3 mmol/L (ref 3.5–5.1)
Sodium: 141 mmol/L (ref 135–145)
Total Bilirubin: 0.6 mg/dL (ref 0.3–1.2)
Total Protein: 7.4 g/dL (ref 6.5–8.1)

## 2022-05-02 ENCOUNTER — Other Ambulatory Visit: Payer: Medicare Other

## 2022-05-07 DIAGNOSIS — J208 Acute bronchitis due to other specified organisms: Secondary | ICD-10-CM | POA: Diagnosis not present

## 2022-05-10 ENCOUNTER — Ambulatory Visit
Admission: RE | Admit: 2022-05-10 | Discharge: 2022-05-10 | Disposition: A | Discharge status: Home / Self Care | Payer: Medicare Other | Source: Ambulatory Visit | Attending: Family Medicine | Admitting: Family Medicine

## 2022-05-10 DIAGNOSIS — Z1231 Encounter for screening mammogram for malignant neoplasm of breast: Secondary | ICD-10-CM | POA: Diagnosis not present

## 2022-05-11 ENCOUNTER — Other Ambulatory Visit: Payer: Self-pay | Admitting: Family Medicine

## 2022-05-11 ENCOUNTER — Ambulatory Visit
Admission: RE | Admit: 2022-05-11 | Discharge: 2022-05-11 | Disposition: A | Discharge status: Home / Self Care | Payer: Medicare Other | Source: Ambulatory Visit | Attending: Family Medicine | Admitting: Family Medicine

## 2022-05-11 DIAGNOSIS — J209 Acute bronchitis, unspecified: Secondary | ICD-10-CM

## 2022-05-12 ENCOUNTER — Other Ambulatory Visit: Payer: Self-pay | Admitting: Family Medicine

## 2022-05-12 DIAGNOSIS — R928 Other abnormal and inconclusive findings on diagnostic imaging of breast: Secondary | ICD-10-CM

## 2022-05-18 ENCOUNTER — Ambulatory Visit: Payer: Medicare Other

## 2022-05-18 ENCOUNTER — Ambulatory Visit
Admission: RE | Admit: 2022-05-18 | Discharge: 2022-05-18 | Disposition: A | Discharge status: Home / Self Care | Payer: Medicare Other | Source: Ambulatory Visit | Attending: Family Medicine | Admitting: Family Medicine

## 2022-05-18 ENCOUNTER — Other Ambulatory Visit: Payer: Self-pay | Admitting: Family Medicine

## 2022-05-18 DIAGNOSIS — R928 Other abnormal and inconclusive findings on diagnostic imaging of breast: Secondary | ICD-10-CM

## 2022-05-18 DIAGNOSIS — R921 Mammographic calcification found on diagnostic imaging of breast: Secondary | ICD-10-CM

## 2022-05-20 ENCOUNTER — Ambulatory Visit
Admission: RE | Admit: 2022-05-20 | Discharge: 2022-05-20 | Disposition: A | Discharge status: Home / Self Care | Payer: Medicare Other | Source: Ambulatory Visit | Attending: Family Medicine | Admitting: Family Medicine

## 2022-05-20 DIAGNOSIS — R921 Mammographic calcification found on diagnostic imaging of breast: Secondary | ICD-10-CM

## 2022-05-20 DIAGNOSIS — R928 Other abnormal and inconclusive findings on diagnostic imaging of breast: Secondary | ICD-10-CM

## 2022-05-20 DIAGNOSIS — N6489 Other specified disorders of breast: Secondary | ICD-10-CM | POA: Diagnosis not present

## 2022-05-20 HISTORY — PX: BREAST BIOPSY: SHX20

## 2022-06-18 IMAGING — MG MM DIGITAL SCREENING BILAT W/ TOMO AND CAD
8 series · 8 of 24 positions shown · non-contrast
Comparison: Previous exam(s).

CLINICAL DATA: Screening.

EXAM:
DIGITAL SCREENING BILATERAL MAMMOGRAM WITH TOMOSYNTHESIS AND CAD
TECHNIQUE: Bilateral screening digital craniocaudal and mediolateral oblique
mammograms were obtained. Bilateral screening digital breast
tomosynthesis was performed. The images were evaluated with
computer-aided detection.

[R MLO synth-2D]
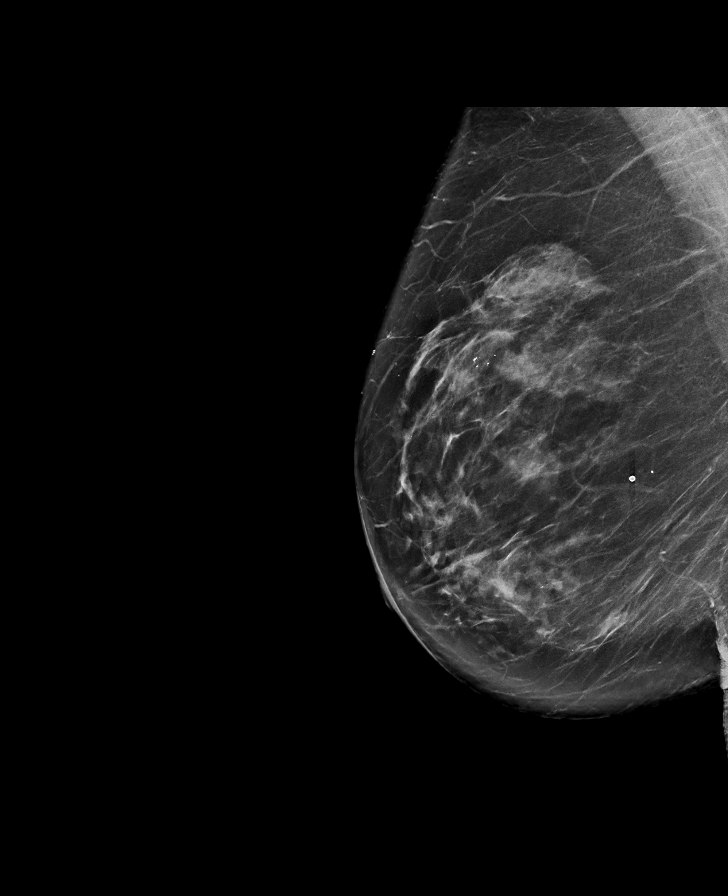

[R CC synth-2D]
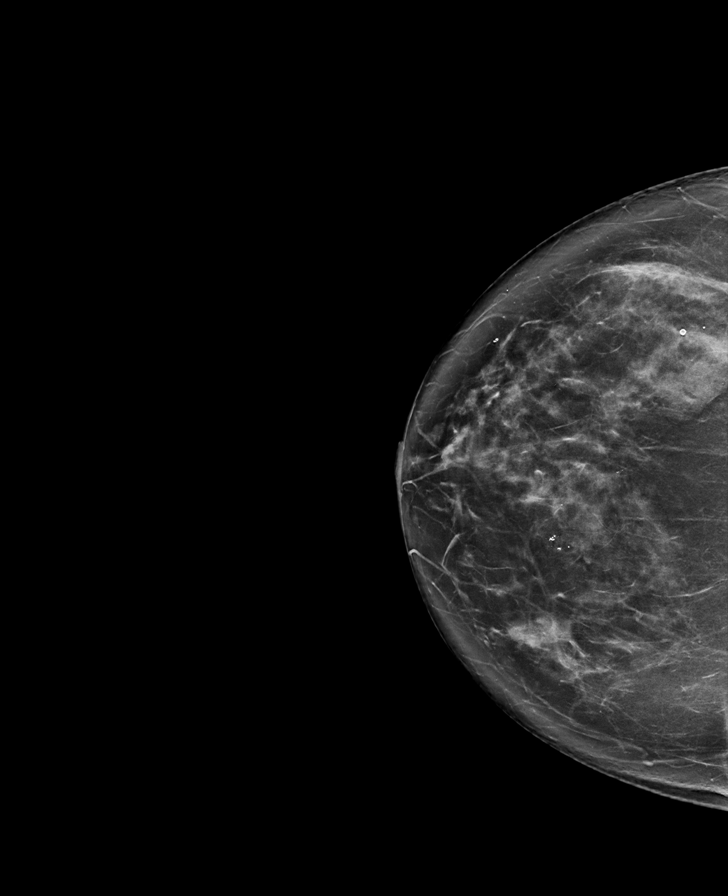

[L CC synth-2D]
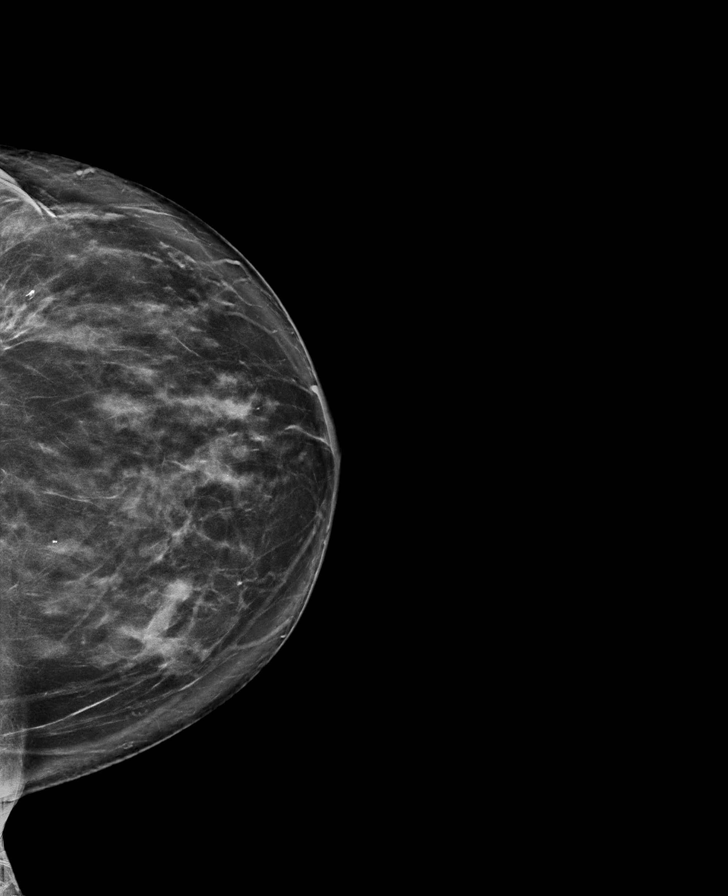

[L MLO synth-2D]
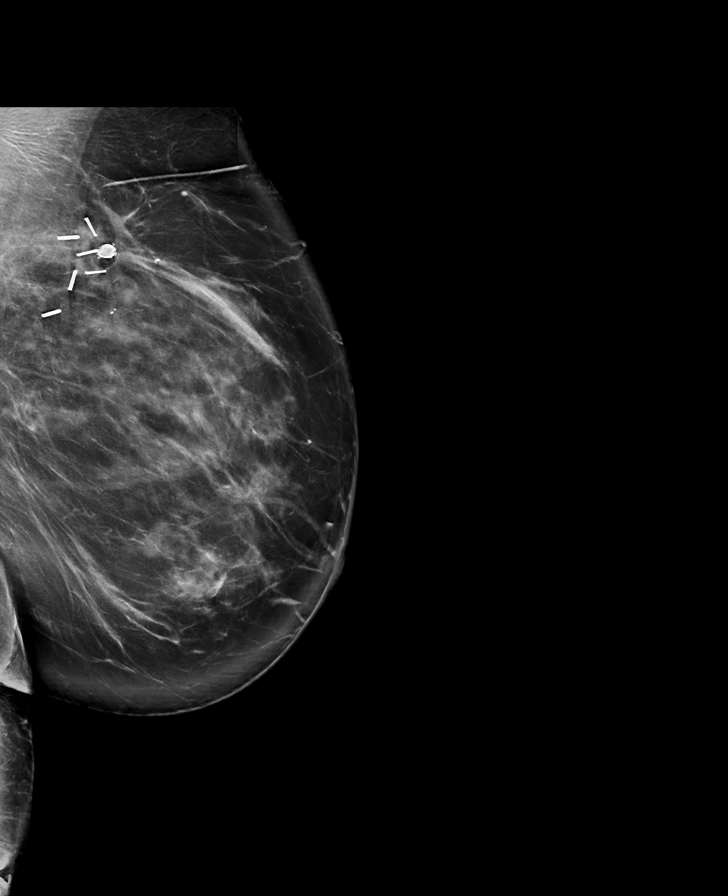

[L CC tomo · tomo slice 47/93.0]
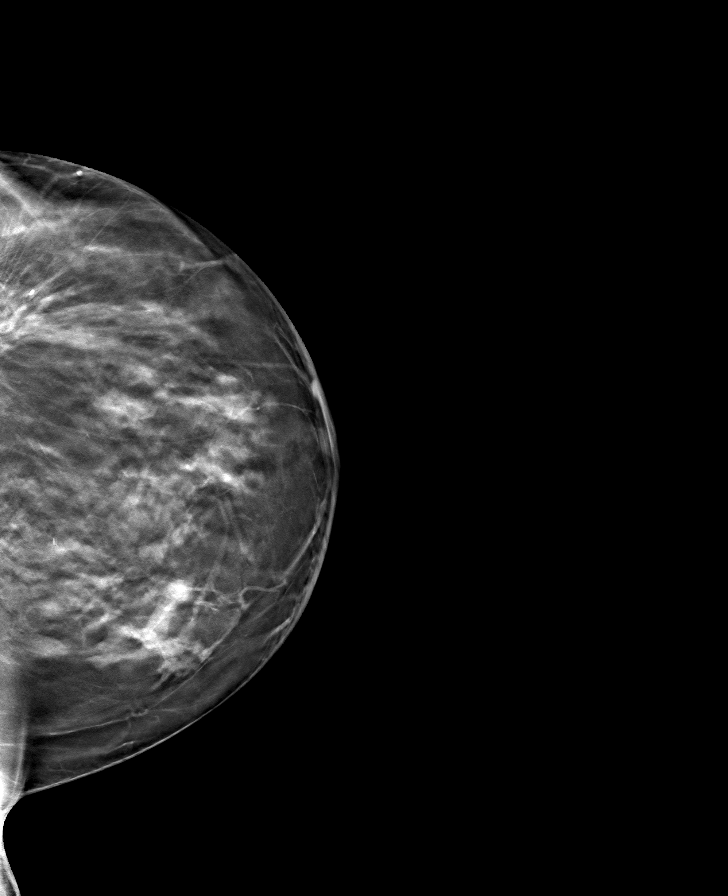

[R CC tomo · tomo slice 43/85.0]
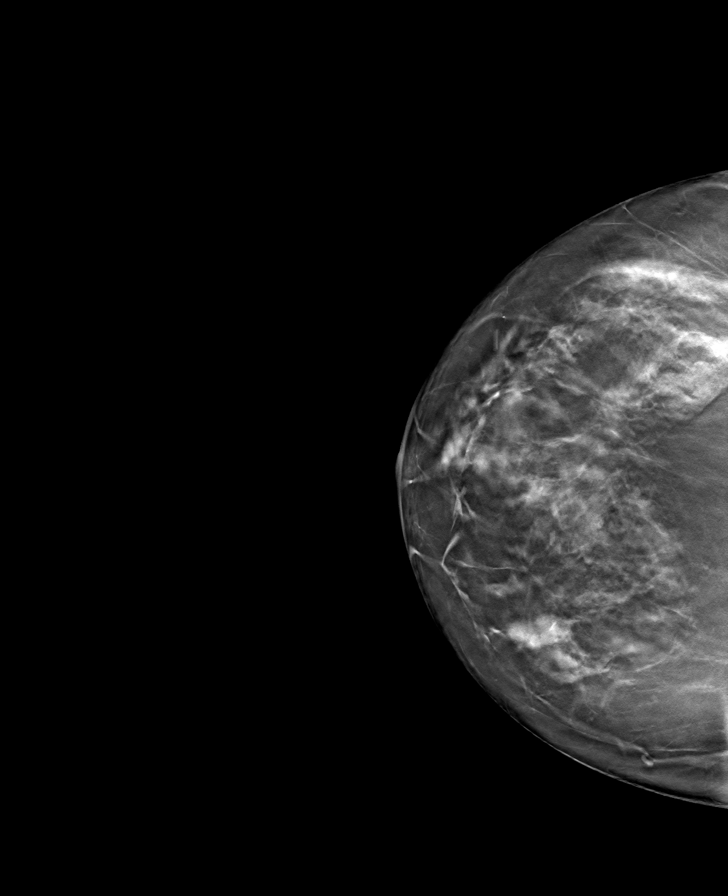

[R MLO tomo · tomo slice 46/91.0]
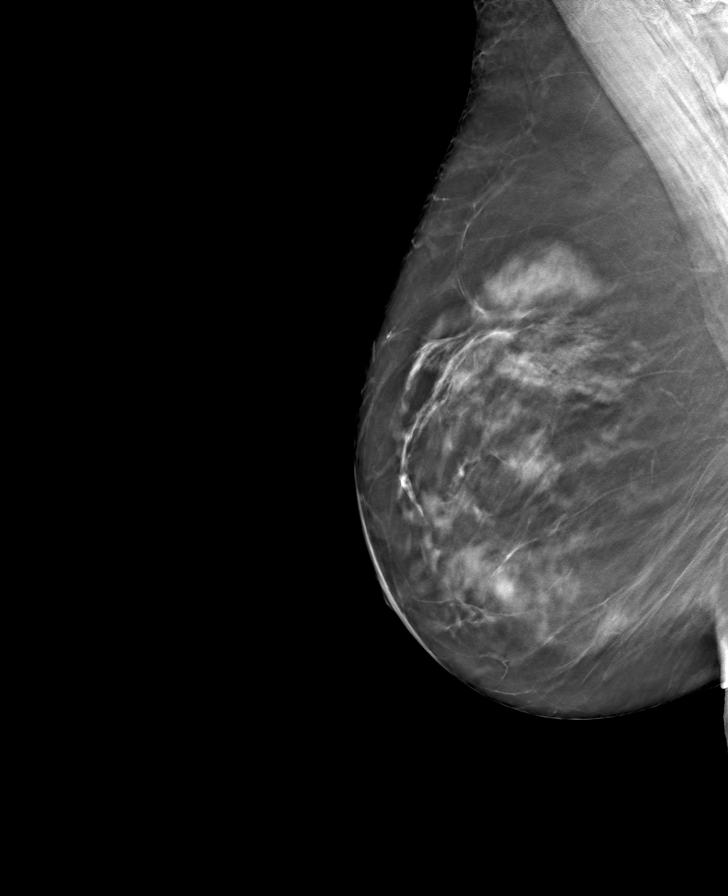

[L MLO tomo · tomo slice 52/103.0]
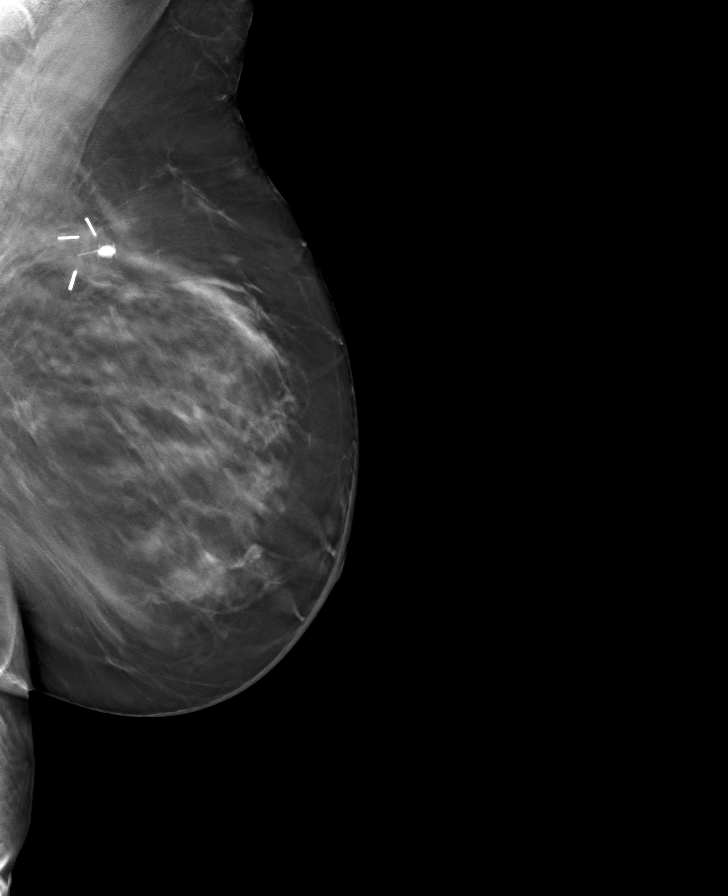

[8 of 24 positions shown; findings below may reference images not displayed]

ACR Breast Density Category c: The breast tissue is heterogeneously
dense, which may obscure small masses.
FINDINGS: There are no findings suspicious for malignancy.
IMPRESSION: No mammographic evidence of malignancy. A result letter of this
screening mammogram will be mailed directly to the patient.

RECOMMENDATION:
Screening mammogram in one year. (Code:[id number])

BI-RADS CATEGORY  1: Negative.

## 2022-08-04 ENCOUNTER — Ambulatory Visit: Admission: RE | Admit: 2022-08-04 | Payer: Medicare Other | Source: Ambulatory Visit

## 2022-08-04 DIAGNOSIS — Z853 Personal history of malignant neoplasm of breast: Secondary | ICD-10-CM | POA: Diagnosis not present

## 2022-08-04 DIAGNOSIS — M8588 Other specified disorders of bone density and structure, other site: Secondary | ICD-10-CM | POA: Diagnosis not present

## 2022-08-04 DIAGNOSIS — E349 Endocrine disorder, unspecified: Secondary | ICD-10-CM | POA: Diagnosis not present

## 2022-08-04 DIAGNOSIS — M958 Other specified acquired deformities of musculoskeletal system: Secondary | ICD-10-CM | POA: Diagnosis not present

## 2022-08-04 DIAGNOSIS — E2839 Other primary ovarian failure: Secondary | ICD-10-CM

## 2022-11-10 DIAGNOSIS — L989 Disorder of the skin and subcutaneous tissue, unspecified: Secondary | ICD-10-CM | POA: Diagnosis not present

## 2022-11-13 DIAGNOSIS — I2510 Atherosclerotic heart disease of native coronary artery without angina pectoris: Secondary | ICD-10-CM | POA: Diagnosis not present

## 2022-11-16 DIAGNOSIS — E559 Vitamin D deficiency, unspecified: Secondary | ICD-10-CM | POA: Diagnosis not present

## 2022-11-16 DIAGNOSIS — R7303 Prediabetes: Secondary | ICD-10-CM | POA: Diagnosis not present

## 2022-11-16 DIAGNOSIS — I2510 Atherosclerotic heart disease of native coronary artery without angina pectoris: Secondary | ICD-10-CM | POA: Diagnosis not present

## 2022-11-22 DIAGNOSIS — E559 Vitamin D deficiency, unspecified: Secondary | ICD-10-CM | POA: Diagnosis not present

## 2022-11-22 DIAGNOSIS — R7303 Prediabetes: Secondary | ICD-10-CM | POA: Diagnosis not present

## 2022-11-22 DIAGNOSIS — Z860100 Personal history of colon polyps, unspecified: Secondary | ICD-10-CM | POA: Diagnosis not present

## 2022-11-22 DIAGNOSIS — K219 Gastro-esophageal reflux disease without esophagitis: Secondary | ICD-10-CM | POA: Diagnosis not present

## 2022-11-22 DIAGNOSIS — G4700 Insomnia, unspecified: Secondary | ICD-10-CM | POA: Diagnosis not present

## 2022-11-22 DIAGNOSIS — Z853 Personal history of malignant neoplasm of breast: Secondary | ICD-10-CM | POA: Diagnosis not present

## 2022-11-22 DIAGNOSIS — Z0000 Encounter for general adult medical examination without abnormal findings: Secondary | ICD-10-CM | POA: Diagnosis not present

## 2022-11-22 DIAGNOSIS — I2510 Atherosclerotic heart disease of native coronary artery without angina pectoris: Secondary | ICD-10-CM | POA: Diagnosis not present

## 2022-11-22 DIAGNOSIS — H9193 Unspecified hearing loss, bilateral: Secondary | ICD-10-CM | POA: Diagnosis not present

## 2022-11-22 DIAGNOSIS — L299 Pruritus, unspecified: Secondary | ICD-10-CM | POA: Diagnosis not present

## 2022-11-22 DIAGNOSIS — M8588 Other specified disorders of bone density and structure, other site: Secondary | ICD-10-CM | POA: Diagnosis not present

## 2023-01-17 DIAGNOSIS — H35372 Puckering of macula, left eye: Secondary | ICD-10-CM | POA: Diagnosis not present

## 2023-01-17 DIAGNOSIS — H4720 Unspecified optic atrophy: Secondary | ICD-10-CM | POA: Diagnosis not present

## 2023-01-17 DIAGNOSIS — H52203 Unspecified astigmatism, bilateral: Secondary | ICD-10-CM | POA: Diagnosis not present

## 2023-01-18 ENCOUNTER — Encounter: Payer: Self-pay | Admitting: Allergy & Immunology

## 2023-01-18 ENCOUNTER — Ambulatory Visit: Payer: Medicare Other | Admitting: Allergy & Immunology

## 2023-01-18 ENCOUNTER — Other Ambulatory Visit: Payer: Self-pay

## 2023-01-18 VITALS — BP 104/66 | HR 74 | Temp 98.0°F | Ht 65.1 in | Wt 166.3 lb

## 2023-01-18 DIAGNOSIS — K219 Gastro-esophageal reflux disease without esophagitis: Secondary | ICD-10-CM

## 2023-01-18 DIAGNOSIS — J302 Other seasonal allergic rhinitis: Secondary | ICD-10-CM

## 2023-01-18 DIAGNOSIS — L299 Pruritus, unspecified: Secondary | ICD-10-CM | POA: Diagnosis not present

## 2023-01-18 DIAGNOSIS — L539 Erythematous condition, unspecified: Secondary | ICD-10-CM

## 2023-01-18 NOTE — Patient Instructions (Addendum)
 1. Pruritus with erythema - We are going to get some blood work to look for weird causes of itching. - I agree with the plan for cetirizine and famotidine daily to suppress this while we are trying to figure this out. - We will call you in 1-2 weeks with the results of the testing. - Agree with the plan for Free and Clear detergent.  - I would like to get your most recent labs from [facility].  2. Hayfever - We can do allergy  testing at the next visit if needed. - If we figure out a cause of these symptoms, we can avoid doing it.   3. Return in about 4 weeks (around 02/15/2023). You can have the follow up appointment with Dr. Iva or a Nurse Practicioner (our Nurse Practitioners are excellent and always have Physician oversight!).    Please inform us  of any Emergency Department visits, hospitalizations, or changes in symptoms. Call us  before going to the ED for breathing or allergy  symptoms since we might be able to fit you in for a sick visit. Feel free to contact us  anytime with any questions, problems, or concerns.  It was a pleasure to meet you today!  Websites that have reliable patient information: 1. American Academy of Asthma, Allergy , and Immunology: www.aaaai.org 2. Food Allergy  Research and Education (FARE): foodallergy.org 3. Mothers of Asthmatics: http://www.asthmacommunitynetwork.org 4. [employer] of Allergy , Asthma, and Immunology: www.acaai.org      "Like" us  on Facebook and Instagram for our latest updates!      A healthy democracy works best when [employer] participate! Make sure you are registered to vote! If you have moved or changed any of your contact information, you will need to get this updated before voting! Scan the QR codes below to learn more!

## 2023-01-18 NOTE — Progress Notes (Signed)
 NEW PATIENT  Date of Service/Encounter:  01/18/23  Consult requested by: Teresa Channel, MD   Assessment:   Pruritus  Erythema - without a raised rash  Seasonal allergic rhinitis - controlled with OTC medications  GERD - treated with behavior changes only  Plan/Recommendations:   1. Pruritus with erythema - We are going to get some blood work to look for weird causes of itching. - I agree with the plan for cetirizine and famotidine daily to suppress this while we are trying to figure this out. - We will call you in 1-2 weeks with the results of the testing. - Agree with the plan for Free and Clear detergent.  - I would like to get your most recent labs from [facility].  2. Hayfever - We can do allergy  testing at the next visit if needed. - If we figure out a cause of these symptoms, we can avoid doing it.   3. Return in about 4 weeks (around 02/15/2023). You can have the follow up appointment with Dr. Iva or a Nurse Practicioner (our Nurse Practitioners are excellent and always have Physician oversight!).     This note in its entirety was forwarded to the Provider who requested this consultation.  Subjective:   Laurie Calhoun is a 68 y.o. 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  Discussed the use of AI scribe software for clinical note transcription with the patient and/or guardian, who gave verbal consent to proceed.  Laurie Calhoun was referred by Teresa Channel, MD.     Laurie Calhoun is a 68 y.o. female presenting for an evaluation of pruritus .  Laurie Calhoun is a delightful female with a history of breast cancer six years ago, psoriasis, and  acid reflux, presenting with a chief complaint of generalized itching that started in October 2024. The itching is severe enough to cause scratching to the point of bleeding, particularly on the feet. The patient denies any associated rash or raised skin lesions.  She does report a couple of occasions where she has had some erythema, but it has not been raised.  The itching is generalized, affecting the back, arms, neck, and abdomen. The patient reports that the itching is so severe that it interferes with daily activities and social interactions.  She has been managing the itching with over-the-counter Allertec (cetirizine) and Pepcid AC (famotidine), which provide relief. However, if these medications are not taken, the itching becomes unbearable. The patient was also prescribed prednisone, which was effective while on the medication, but the itching returned once the medication was stopped.  The patient also reports a history of a skin condition during the COVID-19 pandemic, characterized by flat marks all over the body. A biopsy was performed by a dermatologist, but the results were inconclusive. The patient denies any recent changes in detergents or creams that could have triggered the itching.  She has a history of sinus infections with are treated with antibiotics.  She also has used an inhaler in the past for intermittent episodes of bronchitis.  She has not used this in over 6 months.    She does have a history of acid reflux, triggered by  certain foods and overeating. She is currently retired and keeps busy with volunteer work.     Otherwise, there is no history of other atopic diseases, including drug allergies, stinging insect allergies, or contact dermatitis. There is no significant infectious history. Vaccinations are up to date.    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        Medication List        Accurate as of January 18, 2023 11:16 AM. If you have any questions, ask your nurse or doctor.          aspirin  EC 81 MG tablet Take 1 tablet (81 mg total) by mouth daily. Swallow whole.   cetirizine 10 MG tablet Commonly known as: ZYRTEC Take 10 mg by mouth daily.   famotidine 20 MG tablet Commonly known as: PEPCID Take 20 mg by mouth 2 (two) times daily.   pantoprazole 40 MG tablet Commonly known as: PROTONIX Take 40 mg by mouth daily.   triamcinolone  cream 0.5 % Commonly known as: KENALOG  Apply 1 Application topically 2 (two) times daily.   Vitamin D (Ergocalciferol) 1.25 MG (50000 UNIT) Caps capsule Commonly known as: DRISDOL Take 50,000 Units by mouth every Monday.   zolpidem 5 MG tablet Commonly known as: AMBIEN Take 5 mg by mouth at bedtime as needed.        Birth History: non-contributory  Developmental History: non-contributory  Past Surgical History: Past Surgical History:  Procedure Laterality Date   ABDOMINAL HYSTERECTOMY     BREAST BIOPSY Left 12/01/2015   BREAST BIOPSY Right 05/20/2022   MM RT BREAST BX W LOC DEV 1ST LESION IMAGE BX SPEC STEREO GUIDE 05/20/2022 [facility]   BREAST LUMPECTOMY Left 12/21/2015   2017   BREAST LUMPECTOMY WITH RADIOACTIVE SEED AND SENTINEL LYMPH NODE BIOPSY Left 12/21/2015   Procedure: LEFT BREAST LUMPECTOMY WITH RADIOACTIVE SEED  AND SENTINEL LYMPH NODE BIOPSY;  Surgeon: Donnice Bury, MD;  Location: [facility] OR;  Service: General;  Laterality: Left;   BREAST SURGERY     breast lumpectomy ?2000    COLONOSCOPY     PILONIDAL CYST EXCISION       Family History: Family History  Problem Relation Age of Onset   Lung cancer Mother    Cancer Mother        lung cancer   Cancer Father        GI cancer    Breast cancer Cousin    Cancer Cousin 79       breast cancer      Social History: Lesli lives at home with her family.  She lives in a house that is 70 years old.  There are hardwood's and carpeting throughout the home.  There is gas heating and central cooling.  There is a lab inside of the home.  The dog has been in the home for a number of years.  There are no dust mite covers on the bedding.  There is no tobacco exposure.  She is currently retired.  She moved here 35 years ago from [place] .   Review of systems otherwise negative other than that mentioned in the HPI.    Objective:   Blood pressure 104/66, pulse 74, temperature 98 F (36.7 C), temperature source Temporal, height 5' 5.1 (1.654 m), weight 166 lb 4.8 oz (75.4 kg), SpO2 95%. Body mass index is 27.59 kg/m.     Physical Exam Constitutional:      Appearance: She is well-developed.     Comments: Friendly.  Talkative.  HENT:     Head: Normocephalic and atraumatic.     Right Ear: Tympanic membrane, ear canal and external ear normal. No drainage, swelling or tenderness. Tympanic membrane is not injected, scarred, erythematous, retracted or bulging.     Left Ear: Tympanic membrane, ear canal and external ear normal. No drainage, swelling or tenderness. Tympanic membrane is not injected, scarred, erythematous, retracted or bulging.     Nose: No nasal deformity, septal deviation, mucosal edema or rhinorrhea.     Right Sinus: No maxillary sinus tenderness or frontal sinus tenderness.     Left Sinus: No maxillary sinus tenderness or frontal sinus  tenderness.     Mouth/Throat:     Mouth: Mucous membranes are not pale and not dry.     Pharynx: Uvula midline.  Eyes:     General: Lids are normal. Allergic shiner present.        Right eye: No discharge.        Left eye: No discharge.     Conjunctiva/sclera: Conjunctivae normal.     Right eye: Right conjunctiva is not injected. No chemosis.    Left eye: Left conjunctiva is not injected. No chemosis.    Pupils: Pupils are equal, round, and reactive to light.  Cardiovascular:     Rate and Rhythm: Normal rate and regular rhythm.     Heart sounds: Normal heart sounds.  Pulmonary:     Effort: Pulmonary effort is normal. No tachypnea, accessory muscle usage or respiratory distress.     Breath sounds: Normal breath sounds. No wheezing, rhonchi or rales.  Chest:     Chest wall: No tenderness.  Abdominal:     Tenderness: There is no abdominal tenderness. There is no guarding or rebound.  Lymphadenopathy:     Head:     Right side of head: No submandibular, tonsillar or occipital adenopathy.     Left side of head: No submandibular, tonsillar or occipital adenopathy.     Cervical: No cervical adenopathy.  Skin:    General: Skin is warm.     Capillary Refill: Capillary refill takes less than 2 seconds.     Coloration: Skin is not pale.     Findings: No abrasion, erythema, petechiae or rash. Rash is not papular, urticarial or vesicular.     Comments: She does have some excoriations noted on her bilateral arms.  However, there are no urticarial or eczematous lesions noted today.  Neurological:     Mental Status: She is alert.  Psychiatric:        Behavior: Behavior is cooperative.      Diagnostic studies: labs sent instead         Laurie Shaggy, MD [facility] 

## 2023-01-21 LAB — ALPHA-GAL PANEL
Allergen Lamb IgE: <0.1 kU/L
Beef IgE: <0.1 kU/L
IgE (Immunoglobulin E), Serum: 279 [IU]/mL (ref 6–495)
O215-IgE Alpha-Gal: <0.1 kU/L
Pork IgE: <0.1 kU/L

## 2023-01-22 LAB — PROTEIN ELECTROPHORESIS, SERUM, WITH REFLEX
A/G Ratio: 1.1 (ref 0.7–1.7)
Albumin ELP: 3.7 g/dL (ref 2.9–4.4)
Alpha 1: 0.2 g/dL (ref 0.0–0.4)
Alpha 2: 0.7 g/dL (ref 0.4–1.0)
Beta: 1.1 g/dL (ref 0.7–1.3)
Gamma Globulin: 1.2 g/dL (ref 0.4–1.8)
Globulin, Total: 3.3 g/dL (ref 2.2–3.9)
Total Protein: 7 g/dL (ref 6.0–8.5)

## 2023-01-22 LAB — THYROID ANTIBODIES
Thyroglobulin Antibody: <1 [IU]/mL (ref 0.0–0.9)
Thyroperoxidase Ab SerPl-aCnc: <9 [IU]/mL (ref 0–34)

## 2023-01-22 LAB — SEDIMENTATION RATE: Sed Rate: 24 mm/h (ref 0–40)

## 2023-01-22 LAB — ANTINUCLEAR ANTIBODIES, IFA

## 2023-01-22 LAB — TRYPTASE: Tryptase: 31.9 ug/L — ABNORMAL HIGH (ref 2.2–13.2)

## 2023-01-22 LAB — C-REACTIVE PROTEIN: CRP: 1 mg/L (ref 0–10)

## 2023-01-26 ENCOUNTER — Encounter: Payer: Self-pay | Admitting: Allergy & Immunology

## 2023-01-26 DIAGNOSIS — R748 Abnormal levels of other serum enzymes: Secondary | ICD-10-CM

## 2023-02-02 ENCOUNTER — Ambulatory Visit: Payer: Self-pay | Admitting: Cardiology

## 2023-02-20 ENCOUNTER — Telehealth: Payer: Self-pay | Admitting: Allergy & Immunology

## 2023-02-20 DIAGNOSIS — R748 Abnormal levels of other serum enzymes: Secondary | ICD-10-CM | POA: Diagnosis not present

## 2023-02-20 NOTE — Telephone Encounter (Signed)
Pt informed of results, she is still itching, long as she stays on antihistamines she is fine if she misses any she itches really bad. She is going today to redo the blood drawn for tryptase.

## 2023-02-20 NOTE — Telephone Encounter (Signed)
Patient calling in for lab results

## 2023-02-21 NOTE — Telephone Encounter (Signed)
Great thank you!

## 2023-02-22 ENCOUNTER — Ambulatory Visit: Payer: Medicare Other | Admitting: Allergy & Immunology

## 2023-02-22 ENCOUNTER — Other Ambulatory Visit: Payer: Self-pay

## 2023-02-22 ENCOUNTER — Encounter: Payer: Self-pay | Admitting: Allergy & Immunology

## 2023-02-22 VITALS — BP 104/70 | HR 88 | Temp 97.9°F | Resp 16 | Wt 159.7 lb

## 2023-02-22 DIAGNOSIS — L539 Erythematous condition, unspecified: Secondary | ICD-10-CM | POA: Diagnosis not present

## 2023-02-22 DIAGNOSIS — J302 Other seasonal allergic rhinitis: Secondary | ICD-10-CM

## 2023-02-22 DIAGNOSIS — K219 Gastro-esophageal reflux disease without esophagitis: Secondary | ICD-10-CM | POA: Diagnosis not present

## 2023-02-22 DIAGNOSIS — L299 Pruritus, unspecified: Secondary | ICD-10-CM | POA: Diagnosis not present

## 2023-02-22 DIAGNOSIS — R748 Abnormal levels of other serum enzymes: Secondary | ICD-10-CM

## 2023-02-22 LAB — TRYPTASE: Tryptase: 34.8 ug/L — ABNORMAL HIGH (ref 2.2–13.2)

## 2023-02-22 NOTE — Patient Instructions (Addendum)
1. Pruritus with erythema and elevated tryptase  - We will do the testing for hereditary alpha tryptassemia.  - We will also get some labs to look for mastocytosis. - Your tryptase is not as high as my patients with mastocytosis, however. - There are some good medications for mastocystotis if this is the case.  2. Hayfever - We can do allergy testing at the next visit if needed.  3. Return in about 3 months (around 05/22/2023). You can have the follow up appointment with Dr. Dellis Anes or a Nurse Practicioner (our Nurse Practitioners are excellent and always have Physician oversight!).    Please inform us of any Emergency Department visits, hospitalizations, or changes in symptoms. Call us before going to the ED for breathing or allergy symptoms since we might be able to fit you in for a sick visit. Feel free to contact us anytime with any questions, problems, or concerns.  It was a pleasure to see you again today!  Websites that have reliable patient information: 1. American Academy of Asthma, Allergy, and Immunology: www.aaaai.org 2. Food Allergy Research and Education (FARE): foodallergy.org 3. Mothers of Asthmatics: http://www.asthmacommunitynetwork.org 4. American College of Allergy, Asthma, and Immunology: www.acaai.org      "Like" [place] on Facebook and Instagram for our latest updates!      A healthy democracy works best when [employer] participate! Make sure you are registered to vote! If you have moved or changed any of your contact information, you will need to get this updated before voting! Scan the QR codes below to learn more!

## 2023-02-22 NOTE — Progress Notes (Signed)
FOLLOW UP  Date of Service/Encounter:  02/22/23   Assessment:   Pruritus   Erythema - without a raised rash   Seasonal allergic rhinitis - controlled with OTC medications   GERD - treated with dietary changes only (s/p endoscopy in 2024 at [facility])   Plan/Recommendations:   1. Pruritus with erythema and elevated tryptase  - We will do the testing for hereditary alpha tryptassemia.  - We will also get some labs to look for mastocytosis. - Your tryptase is not as high as my patients with mastocytosis, however. - There are some good medications for mastocystotis if this is the case.  2. Hayfever - We can do allergy testing at the next visit if needed.  3. Return in about 3 months (around 05/22/2023). You can have the follow up appointment with Dr. Dellis Anes or a Nurse Practicioner (our Nurse Practitioners are excellent and always have Physician oversight!).   Subjective:   Laurie Calhoun is a 68 y.o. female presenting today for follow up of  Chief Complaint  Patient presents with   Follow-up    Discuss lab work    Laurie Calhoun has a history of the following: Patient Active Problem List   Diagnosis Date Noted   Malignant neoplasm of upper-outer quadrant of left female breast (HCC) 12/07/2015    History obtained from: chart review and patient.  Discussed the use of AI scribe software for clinical note transcription with the patient and/or guardian, who gave verbal consent to proceed.  Laurie Calhoun is a 68 y.o. female presenting for a follow up visit.  We last saw her in January 2025.  At that time, she presented with pruritus and erythema without a raised rash.  She also endorsed symptoms consistent with seasonal allergic rhinitis, which were controlled with OTC allergy medications.  We did not do environmental allergy testing because her symptoms were under good control with intermittent allergy medications alone.  She did have a history of GERD and what sounds like  esophageal constriction which was better controlled with dietary changes.  We did obtain some labs to look for weird causes of itching.  We continue with cetirizine and famotidine daily to suppress the symptoms while we are figuring out a cause.  We did a tryptase, SPEP, inflammatory markers, ANA, alpha gal, and thyroid antibodies.  Everything was normal except for the tryptase which came back elevated at 31.9.  We ordered a repeat tryptase that came back elevated at 34.8.  Since last visit, she has been about the same.  She continues to report itching.  She experiences persistent itching and redness, primarily affecting her arms, feet, and back. The itching is severe at times, especially on her feet, but there is no associated rash. The symptoms began after a CO2 laser facial treatment, which she suspects may have triggered the condition. She uses body lotion, which her skin absorbs quickly, indicating dryness. She avoids daily allergy medication, but her arms start to itch when she skips a dose.  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 However, she does not exhibit classic symptoms of mastocytosis, such as chronic gastrointestinal issues or persistent rashes. She underwent an endoscopy over a year ago for esophageal issues, which resolved with dietary changes and medication used for short period of time. She experiences heartburn with certain foods and drinks, such as red wine, ice cream, and tomato sauce, but has not had any  recent gastrointestinal symptoms like diarrhea or vomiting.3She does have a hernia that may contribute to reflux symptoms.   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 Occasionally, it flares up, and she uses a prescription salve, triamcinolone, to manage it effectively.  In terms of family history, she does  not recall any similar conditions in her deceased parents. She has not experienced any severe allergic reactions, throat swelling, or insect stings that required medical attention. She does not own an EpiPen.     Otherwise, there have been no changes to her past medical history, surgical history, family history, or social history.    Review of systems otherwise negative other than that mentioned in the HPI.    Objective:   Blood pressure 104/70, pulse 88, temperature 97.9 F (36.6 C), temperature source Temporal, resp. rate 16, weight 159 lb 11.2 oz (72.4 kg), SpO2 99%. Body mass index is 26.49 kg/m.    Physical Exam Vitals reviewed.  Constitutional:      Appearance: She is well-developed.     Comments: Friendly.  Talkative.  HENT:     Head: Normocephalic and atraumatic.     Right Ear: Tympanic membrane, ear canal and external ear normal. No drainage, swelling or tenderness. Tympanic membrane is not injected, scarred, erythematous, retracted or bulging.     Left Ear: Tympanic membrane, ear canal and external ear normal. No drainage, swelling or tenderness. Tympanic membrane is not injected, scarred, erythematous, retracted or bulging.     Nose: No nasal deformity, septal deviation, mucosal edema or rhinorrhea.     Right Turbinates: Enlarged, swollen and pale.     Left Turbinates: Enlarged, swollen and pale.     Right Sinus: No maxillary sinus tenderness or frontal sinus tenderness.     Left Sinus: No maxillary sinus tenderness or frontal sinus tenderness.     Mouth/Throat:     Mouth: Mucous membranes are not pale and not dry.     Pharynx: Uvula midline.  Eyes:     General: Lids are normal. Allergic shiner present.        Right eye: No discharge.        Left eye: No discharge.     Conjunctiva/sclera: Conjunctivae normal.     Right eye: Right conjunctiva is not injected. No chemosis.    Left eye: Left conjunctiva is not injected. No chemosis.    Pupils: Pupils are equal,  round, and reactive to light.  Cardiovascular:     Rate and Rhythm: Normal rate and regular rhythm.     Heart sounds: Normal heart sounds.  Pulmonary:     Effort: Pulmonary effort is normal. No tachypnea, accessory muscle usage or respiratory distress.     Breath sounds: Normal breath sounds. No wheezing, rhonchi or rales.  Chest:     Chest wall: No tenderness.  Abdominal:     Tenderness: There is no abdominal tenderness. There is no guarding or rebound.  Lymphadenopathy:     Head:     Right side of head: No submandibular, tonsillar or occipital adenopathy.     Left side of head: No submandibular, tonsillar or occipital adenopathy.     Cervical: No cervical adenopathy.  Skin:    General: Skin is warm.     Capillary Refill: Capillary refill takes less than 2 seconds.     Coloration: Skin is not pale.     Findings: No abrasion, erythema, petechiae or  rash. Rash is not papular, urticarial or vesicular.     Comments: Dermatographism present.   Neurological:     Mental Status: She is alert.  Psychiatric:        Behavior: Behavior is cooperative.      Diagnostic studies: labs sent instead        Malachi Bonds, MD  [facility]

## 2023-02-25 LAB — HYMENOPTERA VENOM ALLERGY PROF

## 2023-02-28 LAB — HYMENOPTERA VENOM ALLERGY PROF: Tryptase: 31.6 ug/L — ABNORMAL HIGH (ref 2.2–13.2)

## 2023-02-28 LAB — ALLERGEN COMPONENT COMMENTS

## 2023-03-06 ENCOUNTER — Encounter: Payer: Self-pay | Admitting: Allergy & Immunology

## 2023-03-06 DIAGNOSIS — R748 Abnormal levels of other serum enzymes: Secondary | ICD-10-CM

## 2023-03-06 NOTE — Telephone Encounter (Signed)
 Patient called stating she is doing a 24 hour urine test and is wanting to know where she should bring it to after she is done.

## 2023-03-07 DIAGNOSIS — R748 Abnormal levels of other serum enzymes: Secondary | ICD-10-CM | POA: Diagnosis not present

## 2023-03-13 LAB — N-METHYLHISTAMINE, 24 HR, U
Collection Duration (h): 24 h
Creatinine Concent. 24 Hr, U: 65 mg/dL
Creatinine, 24 Hour, U: 1203 mg/(24.h) (ref 603–1783)
N-Methylhistamine, 24 Hr, U: 118 ug/g{creat} (ref 30–200)
Urine Volume (mL): 1850 mL

## 2023-03-13 LAB — PROSTAGLANDIN D2/CREATININE, U
Creatinine, Urine: 192 mg/dL
Prostaglandin D2, urine: 46 pg/mL
Prostaglandin D2/Cr Ratio: 24 ng/g

## 2023-03-13 LAB — PROSTAGLANDIN D2, SERUM: Prostaglandin D2, serum: 16 pg/mL

## 2023-03-14 ENCOUNTER — Encounter: Payer: Self-pay | Admitting: Allergy & Immunology

## 2023-03-15 DIAGNOSIS — R748 Abnormal levels of other serum enzymes: Secondary | ICD-10-CM | POA: Diagnosis not present

## 2023-03-15 LAB — LEUKOTRIENE E4, 24 HR, U
Collection Duration: 24 h
Creatinine Concentration,24 HR: 65 mg/dL
Creatinine, 24 HR, U: 1203 mg/(24.h) (ref 603–1783)
Leukotriene E4, U: 411 pg/mg{creat} — ABNORMAL HIGH (ref <=104)
Urine Volume: 1850 mL

## 2023-03-20 DIAGNOSIS — G4700 Insomnia, unspecified: Secondary | ICD-10-CM | POA: Diagnosis not present

## 2023-03-23 LAB — KIT (D816V) DIGITAL PCR: CKIT Result: NEGATIVE

## 2023-03-26 ENCOUNTER — Encounter: Payer: Self-pay | Admitting: Allergy & Immunology

## 2023-03-27 MED ORDER — MONTELUKAST SODIUM 10 MG PO TABS: 10.0000 mg | ORAL_TABLET | Freq: Every day | ORAL | 1 refills | Status: AC

## 2023-04-05 ENCOUNTER — Other Ambulatory Visit: Payer: Medicare Other

## 2023-04-05 ENCOUNTER — Ambulatory Visit: Payer: Medicare Other | Admitting: Hematology

## 2023-04-16 ENCOUNTER — Encounter: Payer: Self-pay | Admitting: Allergy & Immunology
